# Patient Record
Sex: Female | Born: 1975 | Hispanic: No | Marital: Single | Smoking: Never smoker
Health system: Southern US, Community
[De-identification: ages and names within clinical notes are randomized; demographics above are authoritative.]

## PROBLEM LIST (undated history)

## (undated) DIAGNOSIS — S329XXA Fracture of unspecified parts of lumbosacral spine and pelvis, initial encounter for closed fracture: Secondary | ICD-10-CM

## (undated) HISTORY — DX: Fracture of unspecified parts of lumbosacral spine and pelvis, initial encounter for closed fracture: S32.9XXA

---

## 1999-04-19 ENCOUNTER — Inpatient Hospital Stay (HOSPITAL_COMMUNITY): Admission: AD | Admit: 1999-04-19 | Discharge: 1999-04-24 | Payer: Self-pay | Admitting: Obstetrics & Gynecology

## 1999-04-20 ENCOUNTER — Encounter: Payer: Self-pay | Admitting: Obstetrics & Gynecology

## 1999-10-07 ENCOUNTER — Inpatient Hospital Stay (HOSPITAL_COMMUNITY): Admission: AD | Admit: 1999-10-07 | Discharge: 1999-10-07 | Payer: Self-pay | Admitting: Obstetrics & Gynecology

## 1999-10-08 ENCOUNTER — Inpatient Hospital Stay (HOSPITAL_COMMUNITY): Admission: AD | Admit: 1999-10-08 | Discharge: 1999-10-08 | Payer: Self-pay | Admitting: *Deleted

## 1999-10-08 ENCOUNTER — Inpatient Hospital Stay (HOSPITAL_COMMUNITY): Admission: AD | Admit: 1999-10-08 | Discharge: 1999-10-11 | Payer: Self-pay | Admitting: *Deleted

## 2001-01-20 ENCOUNTER — Ambulatory Visit (HOSPITAL_COMMUNITY): Admission: RE | Admit: 2001-01-20 | Discharge: 2001-01-20 | Payer: Self-pay | Admitting: *Deleted

## 2001-03-30 ENCOUNTER — Inpatient Hospital Stay (HOSPITAL_COMMUNITY): Admission: AD | Admit: 2001-03-30 | Discharge: 2001-03-30 | Payer: Self-pay | Admitting: *Deleted

## 2001-05-13 ENCOUNTER — Inpatient Hospital Stay (HOSPITAL_COMMUNITY): Admission: AD | Admit: 2001-05-13 | Discharge: 2001-05-13 | Payer: Self-pay | Admitting: *Deleted

## 2001-06-17 ENCOUNTER — Inpatient Hospital Stay (HOSPITAL_COMMUNITY): Admission: AD | Admit: 2001-06-17 | Discharge: 2001-06-17 | Payer: Self-pay | Admitting: Obstetrics & Gynecology

## 2001-06-18 ENCOUNTER — Inpatient Hospital Stay (HOSPITAL_COMMUNITY): Admission: AD | Admit: 2001-06-18 | Discharge: 2001-06-20 | Payer: Self-pay | Admitting: *Deleted

## 2002-03-22 ENCOUNTER — Inpatient Hospital Stay (HOSPITAL_COMMUNITY): Admission: AD | Admit: 2002-03-22 | Discharge: 2002-03-22 | Payer: Self-pay | Admitting: *Deleted

## 2002-04-28 ENCOUNTER — Encounter: Admission: RE | Admit: 2002-04-28 | Discharge: 2002-04-28 | Payer: Self-pay | Admitting: Family Medicine

## 2002-05-05 ENCOUNTER — Encounter: Admission: RE | Admit: 2002-05-05 | Discharge: 2002-05-05 | Payer: Self-pay | Admitting: Family Medicine

## 2002-05-19 ENCOUNTER — Ambulatory Visit (HOSPITAL_COMMUNITY): Admission: RE | Admit: 2002-05-19 | Discharge: 2002-05-19 | Payer: Self-pay | Admitting: Family Medicine

## 2002-05-26 ENCOUNTER — Encounter: Admission: RE | Admit: 2002-05-26 | Discharge: 2002-05-26 | Payer: Self-pay | Admitting: Sports Medicine

## 2002-06-24 ENCOUNTER — Encounter: Admission: RE | Admit: 2002-06-24 | Discharge: 2002-06-24 | Payer: Self-pay | Admitting: Family Medicine

## 2002-07-25 ENCOUNTER — Encounter: Admission: RE | Admit: 2002-07-25 | Discharge: 2002-07-25 | Payer: Self-pay | Admitting: Family Medicine

## 2002-07-29 ENCOUNTER — Encounter: Admission: RE | Admit: 2002-07-29 | Discharge: 2002-07-29 | Payer: Self-pay | Admitting: Family Medicine

## 2002-08-24 ENCOUNTER — Encounter: Admission: RE | Admit: 2002-08-24 | Discharge: 2002-08-24 | Payer: Self-pay | Admitting: Family Medicine

## 2002-09-08 ENCOUNTER — Encounter: Admission: RE | Admit: 2002-09-08 | Discharge: 2002-09-08 | Payer: Self-pay | Admitting: Family Medicine

## 2002-09-21 ENCOUNTER — Inpatient Hospital Stay (HOSPITAL_COMMUNITY): Admission: AD | Admit: 2002-09-21 | Discharge: 2002-09-21 | Payer: Self-pay | Admitting: Obstetrics and Gynecology

## 2002-09-21 ENCOUNTER — Encounter: Payer: Self-pay | Admitting: Obstetrics and Gynecology

## 2002-10-13 ENCOUNTER — Encounter: Admission: RE | Admit: 2002-10-13 | Discharge: 2002-10-13 | Payer: Self-pay | Admitting: Family Medicine

## 2002-10-21 ENCOUNTER — Encounter: Admission: RE | Admit: 2002-10-21 | Discharge: 2002-10-21 | Payer: Self-pay | Admitting: Family Medicine

## 2002-10-23 ENCOUNTER — Inpatient Hospital Stay (HOSPITAL_COMMUNITY): Admission: AD | Admit: 2002-10-23 | Discharge: 2002-10-24 | Payer: Self-pay | Admitting: *Deleted

## 2002-12-02 ENCOUNTER — Encounter: Admission: RE | Admit: 2002-12-02 | Discharge: 2002-12-02 | Payer: Self-pay | Admitting: Family Medicine

## 2002-12-29 ENCOUNTER — Encounter: Admission: RE | Admit: 2002-12-29 | Discharge: 2002-12-29 | Payer: Self-pay | Admitting: Family Medicine

## 2009-09-20 ENCOUNTER — Emergency Department (HOSPITAL_COMMUNITY): Admission: EM | Admit: 2009-09-20 | Discharge: 2009-09-20 | Payer: Self-pay | Admitting: Emergency Medicine

## 2010-02-13 ENCOUNTER — Inpatient Hospital Stay (HOSPITAL_COMMUNITY): Admission: AD | Admit: 2010-02-13 | Discharge: 2010-02-13 | Payer: Self-pay | Admitting: Obstetrics & Gynecology

## 2010-02-15 ENCOUNTER — Ambulatory Visit (HOSPITAL_COMMUNITY): Admission: RE | Admit: 2010-02-15 | Discharge: 2010-02-15 | Payer: Self-pay | Admitting: Obstetrics & Gynecology

## 2010-06-12 ENCOUNTER — Inpatient Hospital Stay (HOSPITAL_COMMUNITY): Admission: AD | Admit: 2010-06-12 | Discharge: 2010-06-15 | Payer: Self-pay | Admitting: Obstetrics & Gynecology

## 2010-06-12 ENCOUNTER — Ambulatory Visit: Payer: Self-pay | Admitting: Family Medicine

## 2010-11-03 NOTE — L&D Delivery Note (Signed)
Delivery Note At 2:23 PM a viable female was delivered via Vaginal, Spontaneous Delivery (Presentation: Left Occiput Anterior).  APGAR: 9, 9; weight 7 lb 12.9 oz (3541 g).   Placenta status: Intact, Spontaneous, accessory lobe noted.  Cord: 3 vessels with the following complications: None.  Cord pH: none  Anesthesia: Epidural  Episiotomy: None Lacerations: None Suture Repair: none Est. Blood Loss (mL): 250  Mom to postpartum.  Baby to nursery-stable. Placenta to birthing suites. Plans to breast and bottle feed. Contraception undecided.   Danesha Kirchoff 09/05/2011, 2:55 PM

## 2011-01-17 LAB — CBC
HCT: 35.5 % — ABNORMAL LOW (ref 36.0–46.0)
Hemoglobin: 11.5 g/dL — ABNORMAL LOW (ref 12.0–15.0)
Hemoglobin: 12.2 g/dL (ref 12.0–15.0)
MCH: 29.8 pg (ref 26.0–34.0)
MCH: 30.5 pg (ref 26.0–34.0)
MCHC: 34.3 g/dL (ref 30.0–36.0)
MCV: 86.6 fL (ref 78.0–100.0)
MCV: 89.2 fL (ref 78.0–100.0)
Platelets: 178 10*3/uL (ref 150–400)
RBC: 3.78 MIL/uL — ABNORMAL LOW (ref 3.87–5.11)
RBC: 4.1 MIL/uL (ref 3.87–5.11)
RDW: 14.9 % (ref 11.5–15.5)
WBC: 6.8 10*3/uL (ref 4.0–10.5)

## 2011-01-17 LAB — RPR: RPR Ser Ql: NONREACTIVE

## 2011-01-17 LAB — URINALYSIS, ROUTINE W REFLEX MICROSCOPIC
Bilirubin Urine: NEGATIVE
Glucose, UA: NEGATIVE mg/dL
Ketones, ur: NEGATIVE mg/dL
pH: 7 (ref 5.0–8.0)

## 2011-01-17 LAB — URINE MICROSCOPIC-ADD ON

## 2011-01-22 LAB — URINALYSIS, ROUTINE W REFLEX MICROSCOPIC
Ketones, ur: NEGATIVE mg/dL
Nitrite: NEGATIVE
Specific Gravity, Urine: 1.02 (ref 1.005–1.030)
Urobilinogen, UA: 0.2 mg/dL (ref 0.0–1.0)
pH: 6.5 (ref 5.0–8.0)

## 2011-01-22 LAB — WET PREP, GENITAL
Clue Cells Wet Prep HPF POC: NONE SEEN
Yeast Wet Prep HPF POC: NONE SEEN

## 2011-01-22 LAB — URINE CULTURE: Culture: NO GROWTH

## 2011-01-22 LAB — URINE MICROSCOPIC-ADD ON

## 2011-05-19 ENCOUNTER — Other Ambulatory Visit: Payer: Self-pay

## 2011-05-19 DIAGNOSIS — Z3480 Encounter for supervision of other normal pregnancy, unspecified trimester: Secondary | ICD-10-CM

## 2011-05-19 NOTE — Progress Notes (Signed)
Prenatal labs done today Tracey Valdez 

## 2011-05-20 LAB — OBSTETRIC PANEL
Antibody Screen: NEGATIVE
Basophils Relative: 0 % (ref 0–1)
Eosinophils Absolute: 0 10*3/uL (ref 0.0–0.7)
Eosinophils Relative: 0 % (ref 0–5)
HCT: 36.5 % (ref 36.0–46.0)
Hemoglobin: 12.2 g/dL (ref 12.0–15.0)
Lymphs Abs: 1.5 10*3/uL (ref 0.7–4.0)
MCH: 30.5 pg (ref 26.0–34.0)
MCHC: 33.4 g/dL (ref 30.0–36.0)
MCV: 91.3 fL (ref 78.0–100.0)
Monocytes Absolute: 0.4 10*3/uL (ref 0.1–1.0)
Monocytes Relative: 5 % (ref 3–12)
RBC: 4 MIL/uL (ref 3.87–5.11)
Rh Type: POSITIVE

## 2011-05-20 LAB — SICKLE CELL SCREEN: Sickle Cell Screen: NEGATIVE

## 2011-05-21 LAB — CULTURE, OB URINE

## 2011-05-26 ENCOUNTER — Encounter: Payer: Self-pay | Admitting: Family Medicine

## 2011-06-11 ENCOUNTER — Encounter: Payer: Self-pay | Admitting: Family Medicine

## 2011-07-08 ENCOUNTER — Encounter: Payer: Self-pay | Admitting: Family Medicine

## 2011-07-08 ENCOUNTER — Ambulatory Visit (INDEPENDENT_AMBULATORY_CARE_PROVIDER_SITE_OTHER): Payer: Self-pay | Admitting: Family Medicine

## 2011-07-08 VITALS — BP 94/62 | Temp 98.7°F | Wt 142.0 lb

## 2011-07-08 DIAGNOSIS — Z3480 Encounter for supervision of other normal pregnancy, unspecified trimester: Secondary | ICD-10-CM

## 2011-07-08 DIAGNOSIS — O09529 Supervision of elderly multigravida, unspecified trimester: Secondary | ICD-10-CM

## 2011-07-08 DIAGNOSIS — IMO0002 Reserved for concepts with insufficient information to code with codable children: Secondary | ICD-10-CM

## 2011-07-08 DIAGNOSIS — Z3490 Encounter for supervision of normal pregnancy, unspecified, unspecified trimester: Secondary | ICD-10-CM

## 2011-07-08 LAB — GLUCOSE, CAPILLARY: Glucose-Capillary: 147 mg/dL — ABNORMAL HIGH (ref 70–99)

## 2011-07-08 NOTE — Patient Instructions (Signed)
Nice to meet you. Call Rudell Cobb about the [employer] and we can schedule an ultrasound. Make appointment in 2 weeks. Call [facility] for intrepretor.  Embarazo - Segundo trimestre (Pregnancy - Second Trimester) El segundo trimestre del Psychiatrist (del 3 al ) es un perodo de evolucin rpida para usted y el beb. Hacia el final del sexto mes, el beb mide aproximadamente 23 cm y pesa 680 g. Comenzar a Pharmacologist del beb [place] 18 y las 20 [address] de [place]. Podr sentir las pataditas ("quickening en ingls"). Hay un rpido [employer]. Puede segregar un lquido claro Charity fundraiser) de las [place]. Quizs sienta pequeas contracciones en el vientre (tero) Esto se conoce como falso trabajo de parto o contracciones de Braxton-H[place]. Es como una prctica del trabajo de parto que se produce cuando el beb est listo para salir. Generalmente los problemas de vmitos matinales ya se han superado hacia el final del Medical sales representative. Algunas mujeres desarrollan pequeas manchas oscuras (que se denominan cloasma, mscara del embarazo) en la cara que normalmente se van luego del nacimiento del beb. La exposicin al sol empeora las manchas. Puede desarrollarse acn en algunas mujeres embarazadas, y puede desaparecer en aquellas que ya tienen acn. EXAMENES PRENATALES  Durante los [employer], deber seguir realizando pruebas de [place], segn avance el [place]. Estas pruebas se realizan para controlar su salud y la del beb. Tambin se realizan anlisis de sangre para [employer] niveles de [place]. La anemia (bajo nivel de hemoglobina) es frecuente durante el embarazo. Para prevenirla, se administran hierro y vitaminas. Tambin se le realizarn exmenes para saber si tiene diabetes entre las 24 y las 28 semanas del [place]. Podrn repetirle algunas de las [employer] Enterprises hicieron previamente.   En cada visita le medirn el tamao del tero. Esto se realiza para asegurarse de  que el beb est creciendo correctamente de acuerdo al estado del [place].   Tambin en cada visita prenatal controlarn su presin arterial. Esto se realiza para asegurarse de que no tenga toxemia.   Se controlar su orina para asegurarse de que no tenga infecciones, diabetes o protena en la orina.   Se controlar su peso regularmente para asegurarse que el aumento ocurre al ritmo indicado. Esto se hace para asegurarse que usted y el beb tienen una evolucin normal.   En algunas ocasiones se realiza una prueba de ultrasonido para confirmar el correcto desarrollo y evolucin del beb. Esta prueba se realiza con ondas sonoras inofensivas para el beb, de modo que el profesional pueda calcular ms precisamente la fecha del [place].  Algunas veces se realizan pruebas especializadas del lquido amnitico que rodea al beb. Esta prueba se denomina amniocentesis. El lquido amnitico se obtiene introduciendo una aguja en el vientre (abdomen). Se realiza para Conservator, museum/gallery en los que existe alguna preocupacin acerca de algn problema gentico que pueda sufrir el beb. En ocasiones se lleva a cabo cerca del final del embarazo, si es necesario inducir al Apple Computer. En este caso se realiza para asegurarse que los pulmones del beb estn lo suficientemente maduros como para que pueda vivir fuera del tero. CAMBIOS QUE OCURREN EN EL SEGUNDO TRIMESTRE DEL EMBARAZO Su organismo atravesar numerosos cambios durante el [employer]. Estos pueden variar de [name] persona a otra. [place] con el profesional que la asiste acerca los cambios que usted note y que la preocupen.  Durante el segundo trimestre probablemente sienta un aumento del apetito. Es normal tener "antojos" de Development worker, community.  Esto vara de [name] persona a otra y de un embarazo a Therapist, art.   El abdomen inferior comenzar a abultarse.   Podr tener la necesidad de Geographical information systems officer con ms frecuencia debido a que el tero y el beb presionan sobre  la vejiga. Tambin es frecuente contraer ms infecciones urinarias durante el embarazo (dolor al ConocoPhillips). Puede evitarlas bebiendo gran cantidad de lquidos y vaciando la vejiga antes y despus de Sales promotion account executive.   Podrn aparecer las primeras estras en las caderas, abdomen y [place]. Estos son cambios normales del cuerpo durante el [place]. No existen medicamentos ni ejercicios que puedan prevenir [employer].   Es posible que comience a desarrollar venas inflamadas y abultadas (varices) en las piernas. El uso de medias de descanso, Optometrist sus pies durante 15 minutos, 3 a 4 veces al da y Film/video editor la sal en su dieta ayuda a Journalist, newspaper.   Podr sentir Engineering geologist gstrica a medida que el tero crece y Doctor, general practice. Puede tomar anticidos, con la autorizacin de su mdico, para Financial planner. Tambin es til ingerir pequeas comidas 4 a 5 veces al Futures trader.   La constipacin puede tratarse con un laxante o agregando fibra a su dieta. Beber grandes cantidades de lquidos, comer vegetales, frutas y granos integrales es de [place].   Tambin es beneficioso practicar actividad fsica. Si ha sido una persona Engineer, mining, podr continuar con la Harley[employer]Davidson de las actividades durante el mismo. Si ha sido [employer], puede ser beneficioso que comience con un programa de ejercicios, Museum/gallery exhibitions officer.   Puede desarrollar hemorroides (vrices en el recto) hacia el final del segundo trimestre. Tomar baos de asiento tibios y Chemical engineer cremas recomendadas por el profesional que lo asiste sern de ayuda para los problemas de hemorroides.   Tambin podr Financial risk analyst de espalda durante este momento de su embarazo. Evite levantar objetos pesados, utilice zapatos de taco bajo y [place] buena postura para ayudar a reducir los problemas de [place].   Algunas mujeres embarazadas desarrollan hormigueo y adormecimiento de la mano y los dedos debido a la  hinchazn y compresin de los ligamentos de la mueca (sndrome del tnel carpiano). Esto desaparece una vez que el beb nace.   Como sus pechos se agrandan, Pension scheme manager un sujetador ms grande. Use un sostn de soporte, cmodo y de algodn. No utilice un sostn para amamantar hasta el ltimo mes de embarazo si va a amamantar al beb.   Podr observar una lnea oscura desde el ombligo hacia la zona pbica denominada linea nigra.   Podr observar que sus mejillas se ponen coloradas debido al aumento de flujo sanguneo en la cara.   Podr desarrollar "araitas" en la cara, cuello y pecho. Esto desaparece una vez que el beb nace.  INSTRUCCIONES PARA EL CUIDADO DOMICILIARIO  Es extremadamente importante que evite el cigarrillo, hierbas medicinales, alcohol y las drogas no prescriptas durante el Psychiatrist. Estas sustancias qumicas afectan la formacin y el desarrollo del beb. Evite estas sustancias durante todo el embarazo para asegurar el nacimiento de un beb sano.   La mayor parte de los cuidados que se aconsejan son los mismos que los indicados para Financial risk analyst trimestre del Psychiatrist. Cumpla con las citas tal como se le indic. Siga las instrucciones del profesional que lo asiste con respecto al uso de los medicamentos, el ejercicio y Psychologist, forensic.   Durante el embarazo debe obtener nutrientes para usted y para su beb. Consuma alimentos balanceados  a intervalos regulares. Elija alimentos como carne, pescado, [place] y otros productos lcteos descremados, vegetales, frutas, panes integrales y cereales. El Equities trader cul es el aumento de peso ideal.   Las relaciones sexuales fsicas pueden continuarse hasta cerca del fin del embarazo si no existen otros problemas. Estos problemas pueden ser la prdida temprana (prematura) de lquido amnitico de las [place], sangrado vaginal, dolor abdominal u otros problemas mdicos o del Psychiatrist.   Realice Tesoro Corporation, si no tiene  restricciones. Consulte con el profesional que la asiste si no sabe con certeza si determinados ejercicios son seguros. El mayor aumento de peso tiene Environmental consultant durante los ltimos 2 trimestres del Psychiatrist. El ejercicio la ayudar a:   Engineering geologist.   Ponerla en forma para el parto.   Ayudarla a perder peso luego de haber dado a luz.   Use un buen sostn o como los que se usan para hacer deportes para Paramedic la sensibilidad de las [place]. Tambin puede serle til si lo [place] mientras duerme. Si pierde Product manager, podr Parker Hann[employer]in.   No utilice la baera con agua caliente, baos turcos y saunas durante el [address]   Utilice el cinturn de seguridad sin excepcin cuando conduzca. Este la proteger a usted y al beb en caso de accidente.   Evite comer carne cruda, queso crudo, y el contacto con los utensilios y desperdicios de los gatos. Estos elementos contienen grmenes que pueden causar defectos de nacimiento en el beb.   El segundo trimestre es un buen momento para visitar a su dentista y Software engineer si an no lo ha hecho. Es Primary school teacher los dientes limpios. Utilice un cepillo de dientes blando. Cepllese ms suavemente durante el embarazo.   Es ms fcil perder algo de orina durante el [place]. Apretar y Chief Operating Officer los msculos de la pelvis la ayudar con este problema. Practique detener la miccin cuando est en el bao. Estos son los mismos msculos que Development worker, international aid. Son [employer] Partners mismos msculos que utiliza cuando trata de [employer]der System gases. Puede practicar apretando estos msculos 10 veces, y repetir esto 3 veces por da aproximadamente. Una vez que conozca qu msculos debe apretar, no realice estos ejercicios durante la miccin. Puede favorecerle una infeccin si la orina vuelve hacia atrs.   Pida ayuda si tiene necesidades econmicas, de asesoramiento o nutricionales durante el [place]. El profesional podr ayudarla con respecto a estas  necesidades, o derivarla a otros especialistas.   La piel puede ponerse grasa. Si esto sucede, lvese la cara con un jabn [place], utilice un humectante no graso y [place] con base de aceite o crema.  CONSUMO DE MEDICAMENTOS Y DROGAS DURANTE EL EMBARAZO  Contine tomando las vitaminas apropiadas para esta etapa tal como se le indic. Las vitaminas deben contener un miligramo de cido flico y deben suplementarse con hierro. Guarde todas las vitaminas fuera del alcance de los nios. La ingestin de slo un par de vitaminas o tabletas que contengan hierro puede ocasionar la Newmont Mining en un beb o en un nio pequeo.   Evite el uso de [place], inclusive los de venta libre y hierbas que no hayan sido prescriptos o indicados por el profesional que la asiste. Algunos medicamentos pueden causar problemas fsicos al beb. Utilice los medicamentos de venta libre o de prescripcin para Chief Technology Officer, Environmental health practitioner o la [place], segn se lo indique el profesional que lo asiste. No utilice aspirina.   El consumo de alcohol  est relacionado con ciertos defectos de nacimiento. Esto incluye el sndrome de alcoholismo fetal. Debe evitar el consumo de alcohol en cualquiera de sus formas. El cigarrillo causa nacimientos prematuros y bebs de [place]. El uso de drogas recreativas est absolutamente prohibido. Son muy nocivas para el beb. Un beb que nace de [employer] Express, ser adicto al nacer. Ese beb tendr los mismos sntomas de abstinencia que un adulto.   Infrmele al profesional si consume alguna droga.   No consuma drogas ilegales. Pueden causarle mucho dao al beb.  SOLICITE ATENCIN MDICA SI:  Tiene preguntas o preocupaciones durante su embarazo. Es mejor que llame para Science writer las dudas que esperar hasta su prxima visita prenatal. Thressa Sheller forma se sentir ms tranquila.  SOLICITE ATENCIN MDICA DE INMEDIATO SI:  La temperatura oral se eleva sin motivo por encima de 101 o segn le indique el  profesional que lo asiste.   Tiene una prdida de lquido por la vagina (canal de parto). Si sospecha una ruptura de las [place], tmese la temperatura y llame al profesional para informarlo sobre esto.   Observa unas pequeas manchas, una hemorragia vaginal o elimina cogulos. Notifique al profesional acerca de la cantidad y de cuntos apsitos est utilizando. Unas pequeas manchas de sangre son algo comn durante el Psychiatrist, especialmente despus de Sales promotion account executive.   Presenta un olor desagradable en la secrecin vaginal y observa un cambio en el color, de transparente a blanco.   Contina con las nuseas y no obtiene alivio de los remedios indicados. Vomita sangre o algo similar a la borra del caf.   [place] o sube ms de 900 g. en una semana, o segn lo indicado por el profesional que la asiste.   Observa que se le Southwest Airlines, las manos, los pies o las piernas.   Ha estado expuesta a la rubola y no ha sufrido la enfermedad.   Ha estado expuesta a la quinta enfermedad o a la varicela.   Presenta dolor abdominal. Las molestias en el ligamento redondo son [name] causa no cancerosa (benigna) frecuente de dolor abdominal durante el embarazo. El profesional que la asiste deber evaluarla.   Presenta dolor de cabeza intenso que no se [place].   Presenta fiebre, diarrea, dolor al orinar o le falta la respiracin.   Presenta dificultad para ver, visin borrosa, o visin doble.   Sufre una cada, un accidente de trnsito o cualquier tipo de trauma.   Vive en un hogar en el que existe violencia fsica o mental.  Document Released: 07/30/2005 Document Re-Released: 01/14/2010 [facility] Patient Information 2011 [place], [place].

## 2011-07-08 NOTE — Progress Notes (Signed)
35 yo G5P4004 presents as NOB at 28.3 wk by LMP. No previous complications in 4 SVD. No health conditions. Last baby was born in 06/2010, had PNC at [facility]. Now using adopt-a-mom program. No active issues. Offered genetic counseling today. PHQ-9 score is 7 today.  O: GEN: NAD, No oropharyngeal lesions. CV RRR, no murmurs, Lungs CTAB, fundus firm measures 28 cm. No motor or gross neurologic deficits. No edema, 2+ radial and DP pulses. No vaginal lesions, scant white discharge. Cervix wnl, closed. No tenderness.  A/P: 35 yo G5P4 at 28.3 wks presents with normal pregnancy, AMA, short interval, and late to [facility]. Need to obtain US for anatomy and dating. Pt to obtain orange card or schedule with Dr. Elsie Stain office in next week. Labs reviewed and normal. Glucola screening, pap, GC/Chl today. Discussed preterm labor precautions. Will f/u in 2 weeks for ROB.

## 2011-07-11 ENCOUNTER — Other Ambulatory Visit: Payer: Self-pay

## 2011-07-11 ENCOUNTER — Other Ambulatory Visit: Payer: Self-pay | Admitting: Family Medicine

## 2011-07-11 DIAGNOSIS — Z3490 Encounter for supervision of normal pregnancy, unspecified, unspecified trimester: Secondary | ICD-10-CM

## 2011-07-11 LAB — GLUCOSE, CAPILLARY: Glucose-Capillary: 96 mg/dL (ref 70–99)

## 2011-07-11 NOTE — Progress Notes (Signed)
3 HR GTT DONE TODAY Mackynzie Woolford 

## 2011-07-12 LAB — GLUCOSE TOLERANCE, 3 HOURS
Glucose Tolerance, 2 hour: 100 mg/dL (ref 70–164)
Glucose Tolerance, Fasting: 90 mg/dL (ref 70–104)
Glucose, GTT - 3 Hour: 125 mg/dL (ref 70–144)

## 2011-07-15 NOTE — Progress Notes (Signed)
Note reviewed. 35 yo G5 with following risks: AMA, short interpregnancy interval, late to care, smoking. 1 hour glucola elevated, 3 hour normal. Pt to have ultrasound soon (applying for orange card - if not approved by end of week, schedule with Dr. Gaynell Face) F/u [facility] 2 week interval.

## 2011-07-21 ENCOUNTER — Ambulatory Visit (INDEPENDENT_AMBULATORY_CARE_PROVIDER_SITE_OTHER): Payer: Self-pay | Admitting: Family Medicine

## 2011-07-21 ENCOUNTER — Telehealth: Payer: Self-pay | Admitting: Family Medicine

## 2011-07-21 VITALS — BP 102/58 | Temp 97.9°F | Wt 146.0 lb

## 2011-07-21 DIAGNOSIS — Z3490 Encounter for supervision of normal pregnancy, unspecified, unspecified trimester: Secondary | ICD-10-CM

## 2011-07-21 DIAGNOSIS — Z23 Encounter for immunization: Secondary | ICD-10-CM

## 2011-07-21 DIAGNOSIS — Z3480 Encounter for supervision of other normal pregnancy, unspecified trimester: Secondary | ICD-10-CM

## 2011-07-21 NOTE — Telephone Encounter (Signed)
Patient brought in her [facility] card and I scanned it into her chart.  She said that you needed this in order to make her an ultrasound appt.

## 2011-07-21 NOTE — Patient Instructions (Signed)
Nice to see you! When you get orange card, please call the clinic to schedule your ultrasound! ([phone]) Make an appointment in 2 weeks. Go to [facility] if you have bleeding, contractions, or your water breaks.  Trabajo de parto prematuro,  cuidados en Advice worker (Preterm Labor, Home Care) Se denomina parto prematuro a las contracciones uterinas que causan la apertura (dilatacin), acortamiento y afinamiento del cuello del tero, antes de las 37 semanas de [place]. Es la mayor causa de ingreso de mujeres embarazadas al hospital. [place] causas del [place] prematuro son:  Katha Hamming de los casos se desencadena por motivos desconocidos.   Pequeas zonas de separacin de la placenta (abrupcin).   Polihidramnios (lquido en exceso en el saco amnitico).   Embarazo de gemelos o ms bebs.   Cerviz incompetente (no puede contener al beb debido a que el tejido es demasiado dbil).   Cambios hormonales.   Hemorragia vaginal en ms de uno de los trimestres.   Infeccin en el crvix, la vagin o la vejiga   El consumo de cigarrillos.   Sindrome antifosfolipdico Se produce cuando los anticuerpos afectan las protenas del organismo.  DIAGNSTICO Los factores que ayudan a predecir el trabajo de parto prematuro son:  Historias de Proofreader con trabajo de parto prematuro.   Vaginosis bacteriana en las mujeres que tuvieron trabajo de [place].   Monitoreo de la actividad uterina que demuestre contracciones uterinas.   La protena fibronectina fetal est elevada en mujeres con historia previa del trastorno.   La medicin del largo del crvix con tcnicas de ultrasonido muestra signos de acortamiento antes de la fecha [place].   Realizando una evaluacin conjunta con fibronectina y ecografa cervical, se puede predecir mejor un parto prematuro inminente.   Otros factores de riesgo son:   No pertenecer a Biomedical engineer.   Embarazo a los 17 aos o  [address]   Embarazo a los 35 aos o ms.   Nivel socioeconmico bajo.   Aumento de peso deficiente durante el [employer].  PREVENCIN No todos los partos prematuros pueden prevenirse. Algunas contracciones prematuras pueden prevenirse con medidas simples.  Mantngase bien hidratada. Beba 8 vasos de Warehouse manager. Esto disminuye la posibilidad del parto prematuro. El porcentaje de partos prematuros aumenta en los meses de verano. La deshidratacin hace que el volumen de sangre disminuya. Esto aumenta la concentracin de oxitocina (la hormona que produce las contracciones uterinas) en la [place]. La hidratacin ayuda a prevenir este incremento.   Observe si se presentan signos de infeccin. Estos signos incluyen la sensacin de ardor o el aumento de la necesidad de Geographical information systems officer, secrecin vaginal anormal o el aumento de temperatura sin causa aparente. Esto puedo Engineer, drilling.   Concurra puntualmente a las citas con el profesional que la asiste. Comunquese inmediatamente con el mdico si siente contracciones uterinas.   Busque asesoramiento mdico si tiene preguntas o surge algn problema. Es mejor hacerle las preguntas al profesional que tener un trabajo de parto prematuro sin TEFL teacher.  MANEJO DEL PARTO [employer], DENTRO Y [facility] Hay varios cosas para controlar durante el parto prematuro. Entre ellas se incluyen tanto las medidas mdicas como el cuidado personal para usted y su beb. Generalmente este problema se trata en el hospital. Algunas medidas pueden ser de gran ayuda en el parto prematuro:  Hidratacin (oral o intravenosa) Beba 8 vasos de Warehouse manager.   Haga reposo en cama (en su casa o en el hospital)  Puede resultarle de gran ayuda recostarse sobre el lado izquierdo.   Evite las [employer] y los orgasmos.   Algunos medicamentos (antibiticos) la ayudarn en la prevencin de infecciones. Corre ms riesgos si ha roto la bolsa o si las contracciones estn  causadas por una infeccin [employer] medicamentos como se le indic.   Evaluacin del beb. Estas pruebas ayudarn al profesional a saber si el beb se encuentra en buen estado y lo que Media planner en caso que se produzca el nacimiento de [place] anticipada. Entre ella se incluyen:   El perfil biofsico   Pruebas de estrs y no estrs.   Amniocentesis, para evaluar la madurez pulmonar del feto.   ndice del volumen del lquido amnitico.   Prueba de [place].   Hay medicamentos que ayudan a que los pulmones del beb se desarrollen ms rpidamente. Esto puede suceder si el parto prematuro no puede detenerse.   El profesional podr darle otros consejos acerca de la preparacin en caso de un nacimiento prematuro. Puede ser de ayuda si es necesario administrar corticoides para ayudar a la maduracin de los pulmones del beb.   Los tocolticos (frmacos que ayudan a TEFL teacher las contracciones uterinas) pueden ayudar a Veterinary surgeon [address]  [address]   En algunos casos es beneficiosa la administracin de progesterona.  TRATAMIENTO El mejor tratamiento es la prevencin, Solicitor los factores de riesgo y la deteccin temprana. Asegrese de Science writer con el profesional cules son los signos y los sntomas (qu puede ocurrir) del Sport and exercise psychologist, especialmente si ha sufrido un trabajo de parto prematuro en un embarazo previo. INSTRUCCIONES PARA EL CUIDADO DOMICILIARIO:  Consuma una dieta balanceada y nutritiva.   Tome las vitaminas como se le indic.   Beba entre 6 y 8 vasos de lquidos por Futures trader.   Descanse y duerma lo suficiente.   No tenga relaciones sexuales si tiene un trabajo de parto prematuro o tiene alto riesgo de tenerlo.   Siga las recomendaciones de su mdico con respecto a las actividades, [address], anlisis de [place] y otros exmenes (ecografas, amniocentesis).   Evite el estrs.   Evite los trabajos extenuantes o la actividad fsica prolongada si tiene riesgo elevado  de parto prematuro.   No fume.  SOLICITE ATENCIN MDICA DE INMEDIATO SI:  Tiene contracciones.   Siente dolor abdominal.   Presenta una hemorragia vaginal abundante.   Siente dolor al [employer]oco[employer]illips.   Observa una hemorragia vaginal anormal.   La temperatura se eleva por encima de 102 F (38,9 C).  Document Released: 07/30/2005 Document Re-Released: 08/17/2009 [facility] Patient Information 2011 [place], [place].

## 2011-07-21 NOTE — Progress Notes (Signed)
30.2 ROB visit. Patient refuses interpretor today, has her toddler with her. Has no complaints. Good fetal activity. No bleeding or pain. Has a mild discharge. We reviewed normal labwork done last week. Pt has not yet received orange card but states that she has a letter to bring to [employer] today. Intends to get this so we can schedule [place] ASAP for dating and anatomy. A/P: 35 yo G5P4004 at 30.2 wk by LMP. Complicated by AMA, late to care, financial barriers. Most pressing is the screening ultrasound. Pt to see Rudell Cobb today. Will bring a copy of card to office, if not in next 2 days will call and schedule appointment with Dr. Gaynell Face for [place]. Discussed smoking risks to fetus and PTL precautions. Will f/u in 2 weeks for ROB.

## 2011-07-23 NOTE — Telephone Encounter (Signed)
appt scheduled for Tracey Valdez at [facility] hospital for Thursday 09.20.2012 @ 915 am. I will have Marines Atkins call Tracey Valdez to inform her of this appt.Laureen Ochs, Viann Shove

## 2011-07-24 ENCOUNTER — Ambulatory Visit (HOSPITAL_COMMUNITY)
Admission: RE | Admit: 2011-07-24 | Discharge: 2011-07-24 | Disposition: A | Discharge status: Home / Self Care | Payer: Self-pay | Source: Ambulatory Visit | Attending: Family Medicine | Admitting: Family Medicine

## 2011-07-24 DIAGNOSIS — O358XX0 Maternal care for other (suspected) fetal abnormality and damage, not applicable or unspecified: Secondary | ICD-10-CM | POA: Insufficient documentation

## 2011-07-24 DIAGNOSIS — O09529 Supervision of elderly multigravida, unspecified trimester: Secondary | ICD-10-CM | POA: Insufficient documentation

## 2011-07-24 DIAGNOSIS — Z363 Encounter for antenatal screening for malformations: Secondary | ICD-10-CM | POA: Insufficient documentation

## 2011-07-24 DIAGNOSIS — O0930 Supervision of pregnancy with insufficient antenatal care, unspecified trimester: Secondary | ICD-10-CM | POA: Insufficient documentation

## 2011-07-24 DIAGNOSIS — Z3490 Encounter for supervision of normal pregnancy, unspecified, unspecified trimester: Secondary | ICD-10-CM

## 2011-07-24 DIAGNOSIS — Z1389 Encounter for screening for other disorder: Secondary | ICD-10-CM | POA: Insufficient documentation

## 2011-07-24 DIAGNOSIS — O99330 Smoking (tobacco) complicating pregnancy, unspecified trimester: Secondary | ICD-10-CM | POA: Insufficient documentation

## 2011-08-08 ENCOUNTER — Ambulatory Visit (INDEPENDENT_AMBULATORY_CARE_PROVIDER_SITE_OTHER): Payer: Self-pay | Admitting: Family Medicine

## 2011-08-08 ENCOUNTER — Encounter: Payer: Self-pay | Admitting: Family Medicine

## 2011-08-08 VITALS — BP 106/65 | Temp 97.3°F | Wt 150.0 lb

## 2011-08-08 DIAGNOSIS — Z3490 Encounter for supervision of normal pregnancy, unspecified, unspecified trimester: Secondary | ICD-10-CM

## 2011-08-08 DIAGNOSIS — Z3480 Encounter for supervision of other normal pregnancy, unspecified trimester: Secondary | ICD-10-CM

## 2011-08-08 LAB — CBC
HCT: 34.1 % — ABNORMAL LOW (ref 36.0–46.0)
Hemoglobin: 10.7 g/dL — ABNORMAL LOW (ref 12.0–15.0)
MCHC: 31.4 g/dL (ref 30.0–36.0)
MCV: 87 fL (ref 78.0–100.0)
RDW: 13.6 % (ref 11.5–15.5)
WBC: 7.4 10*3/uL (ref 4.0–10.5)

## 2011-08-08 LAB — POCT WET PREP (WET MOUNT)

## 2011-08-08 NOTE — Patient Instructions (Signed)
Your baby sound good. Try to take TUMS (calcium carbonate) tablets for heartburn. Make an appointment in one week for follow up.  Trabajo de parto prematuro,  cuidados en Advice worker (Preterm Labor, Home Care) Se denomina parto prematuro a las contracciones uterinas que causan la apertura (dilatacin), acortamiento y afinamiento del cuello del tero, antes de las 37 semanas de [place]. Es la mayor causa de ingreso de mujeres embarazadas al hospital. [place] causas del [place] prematuro son:  Katha Hamming de los casos se desencadena por motivos desconocidos.   Pequeas zonas de separacin de la placenta (abrupcin).   Polihidramnios (lquido en exceso en el saco amnitico).   Embarazo de gemelos o ms bebs.   Cerviz incompetente (no puede contener al beb debido a que el tejido es demasiado dbil).   Cambios hormonales.   Hemorragia vaginal en ms de uno de los trimestres.   Infeccin en el crvix, la vagin o la vejiga   El consumo de cigarrillos.   Sindrome antifosfolipdico Se produce cuando los anticuerpos afectan las protenas del organismo.  DIAGNSTICO Los factores que ayudan a predecir el trabajo de parto prematuro son:  Historias de Proofreader con trabajo de parto prematuro.   Vaginosis bacteriana en las mujeres que tuvieron trabajo de [place].   Monitoreo de la actividad uterina que demuestre contracciones uterinas.   La protena fibronectina fetal est elevada en mujeres con historia previa del trastorno.   La medicin del largo del crvix con tcnicas de ultrasonido muestra signos de acortamiento antes de la fecha [place].   Realizando una evaluacin conjunta con fibronectina y ecografa cervical, se puede predecir mejor un parto prematuro inminente.   Otros factores de riesgo son:   No pertenecer a Biomedical engineer.   Embarazo a los 17 aos o [address]   Embarazo a los 35 aos o ms.   Nivel socioeconmico bajo.   Aumento de  peso deficiente durante el [employer].  PREVENCIN No todos los partos prematuros pueden prevenirse. Algunas contracciones prematuras pueden prevenirse con medidas simples.  Mantngase bien hidratada. Beba 8 vasos de Warehouse manager. Esto disminuye la posibilidad del parto prematuro. El porcentaje de partos prematuros aumenta en los meses de verano. La deshidratacin hace que el volumen de sangre disminuya. Esto aumenta la concentracin de oxitocina (la hormona que produce las contracciones uterinas) en la [place]. La hidratacin ayuda a prevenir este incremento.   Observe si se presentan signos de infeccin. Estos signos incluyen la sensacin de ardor o el aumento de la necesidad de Geographical information systems officer, secrecin vaginal anormal o el aumento de temperatura sin causa aparente. Esto puedo Engineer, drilling.   Concurra puntualmente a las citas con el profesional que la asiste. Comunquese inmediatamente con el mdico si siente contracciones uterinas.   Busque asesoramiento mdico si tiene preguntas o surge algn problema. Es mejor hacerle las preguntas al profesional que tener un trabajo de parto prematuro sin TEFL teacher.  MANEJO DEL PARTO [employer], DENTRO Y [facility] Hay varios cosas para controlar durante el parto prematuro. Entre ellas se incluyen tanto las medidas mdicas como el cuidado personal para usted y su beb. Generalmente este problema se trata en el hospital. Algunas medidas pueden ser de gran ayuda en el parto prematuro:  Hidratacin (oral o intravenosa) Beba 8 vasos de Warehouse manager.   Haga reposo en cama (en su casa o en el hospital) Puede resultarle de gran ayuda recostarse sobre el lado izquierdo.   Evite las [employer]  sexuales y los orgasmos.   Algunos medicamentos (antibiticos) la ayudarn en la prevencin de infecciones. Corre ms riesgos si ha roto la bolsa o si las contracciones estn causadas por una infeccin [employer] medicamentos como se le indic.   Evaluacin del  beb. Estas pruebas ayudarn al profesional a saber si el beb se encuentra en buen estado y lo que Media planner en caso que se produzca el nacimiento de [place] anticipada. Entre ella se incluyen:   El perfil biofsico   Pruebas de estrs y no estrs.   Amniocentesis, para evaluar la madurez pulmonar del feto.   ndice del volumen del lquido amnitico.   Prueba de [place].   Hay medicamentos que ayudan a que los pulmones del beb se desarrollen ms rpidamente. Esto puede suceder si el parto prematuro no puede detenerse.   El profesional podr darle otros consejos acerca de la preparacin en caso de un nacimiento prematuro. Puede ser de ayuda si es necesario administrar corticoides para ayudar a la maduracin de los pulmones del beb.   Los tocolticos (frmacos que ayudan a TEFL teacher las contracciones uterinas) pueden ayudar a Veterinary surgeon [address]  [address]   En algunos casos es beneficiosa la administracin de progesterona.  TRATAMIENTO El mejor tratamiento es la prevencin, Solicitor los factores de riesgo y la deteccin temprana. Asegrese de Science writer con el profesional cules son los signos y los sntomas (qu puede ocurrir) del Sport and exercise psychologist, especialmente si ha sufrido un trabajo de parto prematuro en un embarazo previo. INSTRUCCIONES PARA EL CUIDADO DOMICILIARIO:  Consuma una dieta balanceada y nutritiva.   Tome las vitaminas como se le indic.   Beba entre 6 y 8 vasos de lquidos por Futures trader.   Descanse y duerma lo suficiente.   No tenga relaciones sexuales si tiene un trabajo de parto prematuro o tiene alto riesgo de tenerlo.   Siga las recomendaciones de su mdico con respecto a las actividades, [address], anlisis de [place] y otros exmenes (ecografas, amniocentesis).   Evite el estrs.   Evite los trabajos extenuantes o la actividad fsica prolongada si tiene riesgo elevado de parto prematuro.   No fume.  SOLICITE ATENCIN MDICA DE INMEDIATO SI:  Tiene  contracciones.   Siente dolor abdominal.   Presenta una hemorragia vaginal abundante.   Siente dolor al [employer].   Observa una hemorragia vaginal anormal.   La temperatura se eleva por encima de 102 F (38,9 C).  Document Released: 07/30/2005 Document Re-Released: 08/17/2009 [facility] Patient Information 2011 [place], [place].

## 2011-08-08 NOTE — Progress Notes (Signed)
35.6 wk ROB visit. Interpretor presetn. No complaints, feels healthy today. Began having intermittent mild ctxs 3 days ago. Occur q4-8 hours. No LOF, bleeding, vaginal DC, decline in fetal activity, dysuria, fever. No hx preterm labor. Reviewed [place] and dates were 3 weeks farther along than LMP.  SVE: fingertip/thick/high. Moderate white discharge. Uterus/cervix nontender. No lesions noted.  A/P; 35 yo G5P4004 at 35.6 weeks by 3rd trimester [place]. Intermitent ctx but closed cervix. Will check wet prep, GC/Ch to r/o cause of preterm labor. Discussed PTL precautions. Obtained GBS ctx today, repeat lab work. Will f/u in one week for ROB visit.

## 2011-08-09 LAB — RPR

## 2011-08-09 LAB — GC/CHLAMYDIA PROBE AMP, GENITAL: GC Probe Amp, Genital: NEGATIVE

## 2011-08-18 ENCOUNTER — Ambulatory Visit (INDEPENDENT_AMBULATORY_CARE_PROVIDER_SITE_OTHER): Payer: Self-pay | Admitting: Family Medicine

## 2011-08-18 DIAGNOSIS — Z331 Pregnant state, incidental: Secondary | ICD-10-CM

## 2011-08-18 NOTE — Progress Notes (Signed)
37.2 wk ROB visit. No complaints, interpretor present today. Complains of some mild itching all over body. Denies rash, abdominal pain, n/v. Has some mild pubic pressure and infrequent contractions. Plans to breast feed, undecided regarding pediatrician.   A/P: 35 yo G5P4004 at 37.2 weeks. No signs labor currently. GBS negative. F/u in one week for ROB.

## 2011-08-18 NOTE — Patient Instructions (Signed)
Make an appointment in one week for check up.  Psychiatrist Glass blower/designer trimestre) (Pregnancy - Third Trimester) El tercer trimestre del embarazo (los ltimos 3 meses) es el perodo de cambios ms rpidos que atraviesan usted y el beb. El aumento de peso es ms rpido. El beb alcanza un largo de aproximadamente 50 cm (20 pulgadas) y pesa entre 2,700 y 4,500 kg (6 a 10 libras). El beb gana ms tejido graso y ya est listo para la vida fuera del cuerpo de la [place]. Mientras estn en el interior, los bebs tienen perodos de sueo y vigilia, Warehouse manager y tienen hipo. Quizs sienta pequeas contracciones del tero. Este es el falso trabajo de [place]. Tambin se las conoce como contracciones de [place]. Es como una prctica del parto. Los problemas ms habituales de esta etapa del embarazo incluyen mayor dificultad para respirar, hinchazn de las manos y los pies por retencin de lquidos y la necesidad de Geographical information systems officer con ms frecuencia debido a que el tero y el beb presionan sobre la vejiga.  EXAMENES PRENATALES  Durante los [employer], deber seguir realizando pruebas de [place], segn avance el [place]. Estas pruebas se realizan para controlar su salud y la del beb. Tambin se realizan anlisis de sangre para [employer] niveles de [place]. La anemia (bajo nivel de hemoglobina) es frecuente durante el embarazo. Para prevenirla, se administran hierro y vitaminas. Tambin le harn nuevas pruebas para descartar la diabetes. Podrn repetirle algunas de las [employer]ov[employer] Enterprises hicieron previamente.   En cada visita le medirn el tamao del tero. Es para asegurarse de que el beb se desarrolla correctamente.   Tambin en cada visita la pesarn. Esto se realiza para asegurarse de que aumenta de peso al ritmo indicado y que usted y su beb evolucionan normalmente.   En algunas ocasiones se realiza una ecografa para confirmar el correcto desarrollo y evolucin del beb. Esta prueba se realiza con  ondas sonoras inofensivas para el beb, de modo que el profesional pueda calcular con ms precisin la fecha del [place].   Discuta las posibilidades de la anestesia si necesita cesrea.  Algunas veces se realizan pruebas especializadas del lquido amnitico que rodea al beb. Esta prueba se denomina amniocentesis. El lquido amnitico se obtiene introduciendo una aguja en el abdomen (vientre). En ocasiones se lleva a cabo cerca del final del embarazo, si es Optician, dispensing. En este caso se realiza para asegurarse de que los pulmones del beb estn lo suficientemente maduros como para que pueda vivir fuera del tero. CAMBIOS QUE OCURREN EN EL TERCER TRIMESTRE DEL EMBARAZO Su organismo atravesar diferentes cambios durante el embarazo que varan de [name] persona a Educational psychologist. Converse con el profesional que la asiste acerca los cambios que usted note y que la preocupen.  Durante el ltimo trimestre probablemente sienta un aumento del apetito. Es normal tener "antojos" de Development worker, community. Esto vara de [name] persona a otra y de un embarazo a Therapist, art.   Podrn aparecer las primeras estras en las caderas, abdomen y [place]. Estos son cambios normales del cuerpo durante el [place]. No existen medicamentos ni ejercicios que puedan prevenir [employer].   El estreimiento puede tratarse con un laxante o agregando fibra a su dieta. Beber grandes cantidades de lquidos, tomar fibras en forma de verduras, frutas y granos integrales es de [place].   Tambin es beneficioso practicar actividad fsica. Si ha sido una persona Engineer, mining, podr continuar con la [employer]son de las actividades durante el  mismo. Si ha sido menos [place], puede ser beneficioso que comience con un programa de ejercicios, Museum/gallery exhibitions officer. Consulte con el profesional que la asiste antes de comenzar un programa de ejercicios.   Evite el consumo de cigarrillos, el alcohol, los medicamentos no prescritos y las "drogas de la  calle" durante el [place]. Estas sustancias qumicas afectan la formacin y el desarrollo del beb. Evite estas sustancias durante todo el embarazo para asegurar el nacimiento de un beb sano.   Dolor de espalda, venas varicosas y hemorroides podran aparecer o empeorar.   Los movimientos del beb pueden ser ms bruscos y aparecer ms a menudo.   Puede que note dificultades para respirar facilmente.   El ombligo podra salrsele hacia afuera.   Puede segregar un lquido amarillento (calostro) de las [place].   Puede segregar mucus con sangre. Esto normalmente ocurre unos [address] a una semana antes de que comience el [place] de [place].  INSTRUCCIONES PARA EL CUIDADO DOMICILIARIO  La mayor parte de los cuidados que se aconsejan son los mismos que los indicados para las primeras etapas del Psychiatrist. Es importante que concurra a todas las citas con el profesional y siga sus instrucciones con Camera operator a los medicamentos que deba Chemical engineer, a la actividad fsica y a Psychologist, forensic.   Durante el embarazo debe obtener nutrientes para usted y para su beb. Consuma alimentos balanceados a intervalos regulares. Elija alimentos como carne, pescado, [place] y otros productos lcteos descremados, verduras, frutas, panes integrales y cereales. El Equities trader cul es el aumento de peso ideal.   Las relaciones sexuales pueden continuarse hasta casi el final del embarazo, si no se presentan otros problemas como prdida prematura (antes de tiempo) de lquido amnitico, hemorragia vaginal o dolor abdominal (en el vientre).   Realice [employer], si no tiene restricciones. Consulte con el profesional que la asiste si no sabe con certeza si determinados ejercicios son seguros. El mayor aumento de peso se produce [employer]ocker ltimos trimestres del [place].   Haga reposo con frecuencia, con las piernas elevadas, o segn lo necesite para evitar los calambres y el dolor de cintura.   Use un  buen sostn o como los que se usan para hacer deportes para Paramedic la sensibilidad de las [place]. Tambin puede serle til si lo [place] mientras duerme. Si pierde Product manager, podr Parker Hann[employer]in.   No utilice la baera con agua caliente, baos turcos y saunas.   Colquese el cinturn de seguridad cuando conduzca. Este la proteger a usted y al beb en caso de accidente.   Evite comer carne cruda y el contacto con los utensilios y desperdicios de los gatos. Estos elementos contienen grmenes que pueden causar defectos de nacimiento en el beb.   Es fcil perder algo de orina durante el [place]. Apretar y Chief Operating Officer los msculos de la pelvis la ayudar con este problema. Practique detener la miccin cuando est en el bao. Estos son los mismos msculos que Development worker, international aid. Son [employer] Partners mismos msculos que utiliza cuando trata de Ryder System gases. Puede practicar apretando estos msculos WellPoint, y repetir esto tres veces por da aproximadamente. Una vez que conozca qu msculos debe contraer, no realice estos ejercicios durante la miccin. Puede favorecerle una infeccin si la orina vuelve hacia atrs.   Pida ayuda si tiene necesidades econmicas, de asesoramiento o nutricionales durante el [place]. El profesional podr ayudarla con respecto a estas necesidades, o derivarla a otros especialistas.  Practique la ida [employer] hospital a modo de [place].   Tome clases prenatales junto con su pareja para comprender, practicar y hacer preguntas acerca del Aleen Campi de parto y el nacimiento.   Prepare la habitacin del beb.   No viaje fuera de la ciudad a menos que sea absolutamente necesario y con el consejo del mdico.   Use slo zapatos bajos sin taco para tener un mejor equilibrio y prevenir cadas.  EL CONSUMO DE MEDICAMENTOS Y DROGAS DURANTE EL EMBARAZO  Contine tomando las vitaminas apropiadas para esta etapa tal como se le indic. Las vitaminas deben contener un  miligramo de cido flico y deben suplementarse con hierro. Guarde todas las vitaminas fuera del alcance de los nios. La ingestin de slo un par de vitaminas o comprimidos que contengan hierro pueden ocasionar la Newmont Mining en un beb o en un nio pequeo.   Evite el uso de [place], inclusive los de venta [place], que no hayan sido prescritos o indicados por el profesional que la asiste. Algunos medicamentos pueden causar problemas fsicos al beb. Utilice los medicamentos de venta libre o de prescripcin para Chief Technology Officer, Environmental health practitioner o la [place], segn se lo indique el profesional que lo asiste. No utilice aspirina, ibuprofeno (Motrin, Advil, Nuprin) o naproxeno (Aleve) a menos que el profesional la autorice.   El alcohol se asocia a cierto nmero de defectos del nacimiento, incluido el sndrome de alcoholismo fetal. Debe evitar el consumo de alcohol en cualquiera de sus formas. El cigarrillo causa nacimientos prematuros y bebs de bajo peso al nacer. Las drogas de la calle son muy nocivas para el beb y estn absolutamente prohibidas. Un beb que nace de [employer] Express, ser adicto al nacer. Ese beb tendr los mismos sntomas de abstinencia que un adulto.   Infrmele al profesional si consume alguna droga.  SOLICITE ATENCIN MDICA SI: Tiene alguna preocupacin Academic librarian. Es mejor que llame para formular las preguntas si no puede esperar hasta la prxima visita, que sentirse preocupada por ellas.  DECISIONES ACERCA DE LA CIRCUNCISIN Usted puede saber o no cul es el sexo de su beb. Si es un varn, ste es el momento de pensar acerca de la circuncisin. La circuncisin es la extirpacin del prepucio. Esta es la piel que cubre el extremo sensible del pene. No hay un motivo mdico que lo justifique. Generalmente la decisin se toma segn lo que sea popular en ese momento, o se basa en creencias religiosas. Podr conversar estos temas con el profesional que la asiste. SOLICITE ATENCIN  MDICA DE INMEDIATO SI:  La temperatura oral se eleva sin motivo por encima de 101 o segn le indique el profesional que la asiste.   Tiene una prdida de lquido por la vagina (canal de parto). Si sospecha una ruptura de las [place], tmese la temperatura y llame al profesional para informarlo sobre esto.   Observa unas pequeas manchas, una hemorragia vaginal o elimina cogulos. Avsele al profesional acerca de la cantidad y de cuntos apsitos est utilizando.   Presenta un olor desagradable en la secrecin vaginal y observa un cambio en el color, de transparente a blanco.   Ha vomitado durante ms de 24 horas.   Presenta escalofros o fiebre.   Comienza a sentir falta de aire.   Siente ardor al Beatrix Shipper.   [place] o sube ms de 900 g (ms de 2 libras), o segn lo indicado por el profesional que la asiste. Observa que sbitamente se le Southwest Airlines, las  manos, los pies o las piernas.   Presenta dolor abdominal. Las molestias en el ligamento redondo son [name] causa benigna (no cancerosa) frecuente de Engineer, mining abdominal durante el Psychiatrist, pero el profesional que la asiste deber evaluarlo.   Presenta dolor de cabeza intenso que no se [place].   Si no siente los movimientos del beb durante ms de tres horas. Si piensa que el beb no se mueve tanto como lo haca habitualmente, coma algo que Psychologist, clinical y Target Corporation lado izquierdo durante [place]. El beb debe moverse al menos 4  5 veces por hora. Comunquese inmediatamente si el beb se mueve menos que lo indicado.   Se cae, se ve involucrada en un accidente automovilstico o sufre algn tipo de traumatismo.   En su hogar hay violencia mental o fsica.  Document Released: 07/30/2005 Document Re-Released: 08/17/2009 [facility] Patient Information 2011 [place], [place].

## 2011-08-28 ENCOUNTER — Ambulatory Visit (INDEPENDENT_AMBULATORY_CARE_PROVIDER_SITE_OTHER): Payer: Self-pay | Admitting: *Deleted

## 2011-08-28 ENCOUNTER — Other Ambulatory Visit: Payer: Self-pay | Admitting: Family Medicine

## 2011-08-28 ENCOUNTER — Telehealth: Payer: Self-pay | Admitting: Family Medicine

## 2011-08-28 ENCOUNTER — Ambulatory Visit (INDEPENDENT_AMBULATORY_CARE_PROVIDER_SITE_OTHER): Payer: Self-pay | Admitting: Family Medicine

## 2011-08-28 ENCOUNTER — Telehealth: Payer: Self-pay | Admitting: *Deleted

## 2011-08-28 VITALS — BP 108/67 | Wt 152.0 lb

## 2011-08-28 DIAGNOSIS — L299 Pruritus, unspecified: Secondary | ICD-10-CM

## 2011-08-28 DIAGNOSIS — O368190 Decreased fetal movements, unspecified trimester, not applicable or unspecified: Secondary | ICD-10-CM

## 2011-08-28 DIAGNOSIS — Z331 Pregnant state, incidental: Secondary | ICD-10-CM

## 2011-08-28 LAB — COMPREHENSIVE METABOLIC PANEL
AST: 23 U/L (ref 0–37)
Alkaline Phosphatase: 294 U/L — ABNORMAL HIGH (ref 39–117)
Alkaline Phosphatase: 306 U/L — ABNORMAL HIGH (ref 39–117)
BUN: 6 mg/dL (ref 6–23)
CO2: 23 mEq/L (ref 19–32)
CO2: 21 mEq/L (ref 19–32)
Chloride: 106 mEq/L (ref 96–112)
Creat: 0.55 mg/dL (ref 0.50–1.10)
Creat: 0.6 mg/dL (ref 0.50–1.10)
Glucose, Bld: 92 mg/dL (ref 70–99)
Glucose, Bld: 87 mg/dL (ref 70–99)
Potassium: 3.6 mEq/L (ref 3.5–5.3)
Sodium: 140 mEq/L (ref 135–145)
Sodium: 139 mEq/L (ref 135–145)
Total Bilirubin: 0.5 mg/dL (ref 0.3–1.2)
Total Bilirubin: 0.5 mg/dL (ref 0.3–1.2)

## 2011-08-28 MED ORDER — PRENATAL 27-0.8 MG PO TABS: 1.0000 | ORAL_TABLET | Freq: Every day | ORAL | Status: DC

## 2011-08-28 NOTE — Progress Notes (Signed)
P = 76  NST for decr. FM

## 2011-08-28 NOTE — Progress Notes (Signed)
38.5wk ROB visit. Used language line to discuss as pt did not schedule adopt-a-mom interpretor. Has increased itching allover body, worse at night. No skin rash or discoloration. Denies any abdominal pain, decline in fetal movement. No DC, bleeding. Exam: No gross jaundice or icterus, excoriations on ankles. No rash. SVE: vertex, 1cm/20/-3. No vag discharge. TcB 2.2  A/P; 35 yo G5P4004 at 38.5 with generalized pruritis, concern for cholestasis of pregnancy. Given worsening of pruritis, will check stat CMET and Bile acids. If elevated, will schedule induction. Scheduled NST today to evaluate fetal wellbeing. Discussed with patient if this is cholestasis of pregnancy, small fetal risk and need for close follow up. PATP.

## 2011-08-28 NOTE — Telephone Encounter (Signed)
appt made for pt to go to [facility] to have NST done at 2 pm TODAY!! Pt informed prior to leaving clinic today.Loralee Pacas [place]

## 2011-08-28 NOTE — Patient Instructions (Addendum)
[facility] I will call you if we need to induce labor. If not, we will see you in one week for appointment.  Colestasis del embarazo (Cholestasis of Pregnancy) La colestasis es la suspensin o la restriccin del flujo de bilis desde la vescula biliar al intestino. Es un trastorno poco frecuente. Puede ser leve o grave.Tracey Valdez ocurre durante el ultimo trimester. El trastorno se produce debido a que el hgado no puede eliminar la bilis. La bilis contiene los productos de desecho de los glbulos rojos. Cuando el nivel de estos productos de desecho (bilirrubina) se eleva en el hgado y stos se vierten en la sangre, producen ictericia. Se denomina ictericia al trastorno que ocasiona tono amarillento en la piel. Generalmente Tracey Valdez en el que se observa es en el rea blanca del ojo. A medida que la bilirrubina se eleva, causa picazn en la piel y a veces una erupcin. CAUSAS  Sufrir trastornos en el hgado o fuera del mismo (clculos biliares que obstruyen la abertura de la vescula biliar.   Durante el [name], puede deberse al aumento de estrgenos en la [name].   Puede ser [name] al estrgeno que contienen las pldoras anticonceptivas.   Por consumo de drogas y alcohol.   Por ciertos tipos de anemia (bajo nivel de glbulos rojos) antes del embarazo. Hable con su mdico si tiene anemia.Tracey Valdez ictericia en el embarazo tambin se observa en los casos de toxemia grave, vmitos continuos y abundantes (hiperemesis [name]) e hgado graso agudo del Tracey Valdez.   Otras causas de ictericia que deben descartarse son la hepatitis, enfermedad heptica crnica (cirrosis), hepatitis autoinmune y en la mononucleosis infecciosa grave.  En general, se trata de un trastorno benigno. Sin embargo, puede causar parto premature y Tracey Valdez en peligro la salud del beb. Esta ictericia es similar a la ictericia del recin nacido. Si los niveles de bilirrubina  se elevan demasiado, pueden causar retraso mental en el nio. SNTOMAS  Picazn   Piel amarilla (ictericia)   Erupcin  TRATAMIENTO Este problema desaparece luego del nacimiento del beb. El tratamiento est dirigido a Tracey Valdez. Si el trastorno es grave y la ictericia alcanza niveles peligrosos, podrn inducirle e trabajo de parto o practicarle una intervencin cesrea. Generalmente no produce un Tracey Valdez en el hgado de la Tracey Valdez. Sin embargo, la colestasis puede ocurrir en los embarazos futuros. INSTRUCCIONES PARA EL CUIDADO DOMICILIARIO  No consuma drogas ni alcohol.   No tome ningn medicamento excepto que se lo haya indicado su mdico.   Cumpla con las citas tal como se le indic. Es importante para su salud y la de su beb.   Slo utilice cremas y medicamentos para la picazn que le ha indicado su mdico.  SOLICITE ATENCIN MDICA DE INMEDIATO SI:  La temperatura oral se eleva sin motivo a ms de 102 F (38.9 C) o segn le indique su mdico   No puede controlar los sntomas con los medicamentos o las medidas indicadas por su mdico. Se siente empeorar.   Tiene nuseas, vmitos o dolor abdominal.   Sufre una cefalea grave.   Tiene problemas visuales (visin doble o borrosa).   Tiene contracciones uterinas.   Presenta una hemorragia vaginal abundante.  Document Released: 07/30/2005 Document Revised: 07/02/2011 Tracey Valdez Patient Information 2012 [place], [place].

## 2011-08-28 NOTE — Telephone Encounter (Signed)
[facility]ed [facility] as patient's labs have not yet resulted and ordered stat. Only notable abnormality is alk phos elevated at 305 and low protein. Total bilirubin 0.5. Lab tech states that bile acids test usually takes 3-5 days to result. Patient had NST today. Will plan to repeat this in 3 days and discuss need for induction with OB faculty.

## 2011-08-29 ENCOUNTER — Telehealth: Payer: Self-pay | Admitting: Family Medicine

## 2011-08-29 MED ORDER — HYDROXYZINE HCL 25 MG PO TABS: 25.0000 mg | ORAL_TABLET | Freq: Three times a day (TID) | ORAL | Status: DC | PRN

## 2011-08-29 NOTE — Telephone Encounter (Signed)
DIscussed case with [facility] Dr. Natale Milch who discussed with Attending Dr. Debroah Loop regarding this patient with clinical signs concerning for late onset cholestasis. Thus far CMET, LFTs and TBili are within normal limits. NST yesterday and physical exam are benign. Will await bile acid serum test and if elevated, then schedule induction of labor. Otherwise treat symptomatically for itching.

## 2011-09-01 ENCOUNTER — Telehealth (HOSPITAL_COMMUNITY): Payer: Self-pay | Admitting: Family Medicine

## 2011-09-01 NOTE — Telephone Encounter (Signed)
I tried to call pt but pt does answer the phone I let a VM, I tried also her emergency contact and pt's friends will notify pt that we call her.Pt will call us back

## 2011-09-04 ENCOUNTER — Ambulatory Visit (INDEPENDENT_AMBULATORY_CARE_PROVIDER_SITE_OTHER): Payer: Self-pay | Admitting: Family Medicine

## 2011-09-04 DIAGNOSIS — Z331 Pregnant state, incidental: Secondary | ICD-10-CM

## 2011-09-04 NOTE — Progress Notes (Signed)
Pt here at 39 and 5/7 for follow up visit. Pt reports fetal movement is only once per hour since Monday.  No LOF, No VB, +irreg ctx.  Still with itching. Same as before. Notes a contraction in clinic.   Pt to go to [facility] for decreased fetal movement.  Has no one to drive her there, but feels fine and will go right now and have friends or family members meet her there to care for her 53 month old, who is with her now. Pt reports she has had her flu shot already this year.

## 2011-09-04 NOTE — Patient Instructions (Signed)
Please go to [facility] now.  They are expecting you. If they send you home from there, please come back and see Dr. Cristal Ford in 1 week. Please count and make sure your baby is moving at least 5 times in 1 hour, or 10 times in 2 hours. If you go home today after you go to [facility], be sure to go back to the hospital if you break your water, if you have bleeding, if you have contractions that happen every 5 minutes for 2 hours, or if your baby is not moving well. Por favor vaya al [facility] ahora. Le estn esperando.Si te mandan a casa desde all, por favor vuelva a ver Dr. Cristal Ford en 1 semana.Por favor, cuente y asegrese de que su beb se est moviendo menos 5 veces en 1 hora, o 10 veces en 2 horas.Si vas a casa hoy despus de ir al [facility], asegrese de volver al hospital si te rompes el agua, si tiene sangrado, si tiene contracciones que ocurren cada 5 minutos durante 2 horas, o si su beb no est [employer].

## 2011-09-05 ENCOUNTER — Inpatient Hospital Stay (HOSPITAL_COMMUNITY): Payer: Medicaid Other | Admitting: Anesthesiology

## 2011-09-05 ENCOUNTER — Encounter (HOSPITAL_COMMUNITY): Payer: Self-pay | Admitting: *Deleted

## 2011-09-05 ENCOUNTER — Encounter (HOSPITAL_COMMUNITY): Payer: Self-pay | Admitting: Anesthesiology

## 2011-09-05 ENCOUNTER — Encounter (HOSPITAL_COMMUNITY): Payer: Self-pay | Admitting: Family Medicine

## 2011-09-05 ENCOUNTER — Inpatient Hospital Stay (HOSPITAL_COMMUNITY)
Admission: AD | Admit: 2011-09-05 | Discharge: 2011-09-06 | DRG: 775 | Disposition: A | Discharge status: Home / Self Care | Payer: Medicaid Other | Source: Ambulatory Visit | Attending: Obstetrics & Gynecology | Admitting: Obstetrics & Gynecology

## 2011-09-05 DIAGNOSIS — Z3490 Encounter for supervision of normal pregnancy, unspecified, unspecified trimester: Secondary | ICD-10-CM

## 2011-09-05 DIAGNOSIS — O09529 Supervision of elderly multigravida, unspecified trimester: Principal | ICD-10-CM | POA: Diagnosis present

## 2011-09-05 LAB — CBC
HCT: 35.4 % — ABNORMAL LOW (ref 36.0–46.0)
Hemoglobin: 11.6 g/dL — ABNORMAL LOW (ref 12.0–15.0)
MCH: 27 pg (ref 26.0–34.0)
MCHC: 32.8 g/dL (ref 30.0–36.0)

## 2011-09-05 LAB — ABO/RH: ABO/RH(D): O POS

## 2011-09-05 MED ORDER — EPHEDRINE 5 MG/ML INJ
10.0000 mg | INTRAVENOUS | Status: DC | PRN
  Filled 2011-09-05: qty 4

## 2011-09-05 MED ORDER — ACETAMINOPHEN 325 MG PO TABS: 650.0000 mg | ORAL_TABLET | ORAL | Status: DC | PRN

## 2011-09-05 MED ORDER — METHYLERGONOVINE MALEATE 0.2 MG/ML IJ SOLN: 0.2000 mg | INTRAMUSCULAR | Status: DC | PRN

## 2011-09-05 MED ORDER — ONDANSETRON HCL 4 MG/2ML IJ SOLN: 4.0000 mg | Freq: Four times a day (QID) | INTRAMUSCULAR | Status: DC | PRN

## 2011-09-05 MED ORDER — EPHEDRINE 5 MG/ML INJ
10.0000 mg | INTRAVENOUS | Status: DC | PRN
  Filled 2011-09-05 (×2): qty 4

## 2011-09-05 MED ORDER — DIPHENHYDRAMINE HCL 50 MG/ML IJ SOLN: 12.5000 mg | INTRAMUSCULAR | Status: DC | PRN

## 2011-09-05 MED ORDER — SENNOSIDES-DOCUSATE SODIUM 8.6-50 MG PO TABS
2.0000 | ORAL_TABLET | Freq: Every day | ORAL | Status: DC
  Administered 2011-09-05: 2 via ORAL

## 2011-09-05 MED ORDER — OXYTOCIN BOLUS FROM INFUSION
500.0000 mL | Freq: Once | INTRAVENOUS | Status: DC
  Filled 2011-09-05: qty 1000
  Filled 2011-09-05: qty 500

## 2011-09-05 MED ORDER — ONDANSETRON HCL 4 MG/2ML IJ SOLN: 4.0000 mg | INTRAMUSCULAR | Status: DC | PRN

## 2011-09-05 MED ORDER — OXYTOCIN 20 UNITS IN LACTATED RINGERS INFUSION - SIMPLE
1.0000 m[IU]/min | INTRAVENOUS | Status: DC
  Administered 2011-09-05: 1 m[IU]/min via INTRAVENOUS
  Filled 2011-09-05: qty 1000

## 2011-09-05 MED ORDER — TERBUTALINE SULFATE 1 MG/ML IJ SOLN: 0.2500 mg | Freq: Once | INTRAMUSCULAR | Status: AC | PRN

## 2011-09-05 MED ORDER — CITRIC ACID-SODIUM CITRATE 334-500 MG/5ML PO SOLN: 30.0000 mL | ORAL | Status: DC | PRN

## 2011-09-05 MED ORDER — OXYTOCIN 20 UNITS IN LACTATED RINGERS INFUSION - SIMPLE
125.0000 mL/h | Freq: Once | INTRAVENOUS | Status: DC
  Administered 2011-09-05: 125 mL/h via INTRAVENOUS

## 2011-09-05 MED ORDER — PRENATAL PLUS 27-1 MG PO TABS
1.0000 | ORAL_TABLET | Freq: Every day | ORAL | Status: DC
  Administered 2011-09-06: 1 via ORAL
  Filled 2011-09-05: qty 1

## 2011-09-05 MED ORDER — IBUPROFEN 600 MG PO TABS
600.0000 mg | ORAL_TABLET | Freq: Four times a day (QID) | ORAL | Status: DC
  Administered 2011-09-05 – 2011-09-06 (×4): 600 mg via ORAL
  Filled 2011-09-05 (×3): qty 1

## 2011-09-05 MED ORDER — LIDOCAINE HCL 1.5 % IJ SOLN
INTRAMUSCULAR | Status: DC | PRN
  Administered 2011-09-05 (×2): 4 mL via INTRADERMAL

## 2011-09-05 MED ORDER — LACTATED RINGERS IV SOLN
500.0000 mL | Freq: Once | INTRAVENOUS | Status: AC
  Administered 2011-09-05: 500 mL via INTRAVENOUS

## 2011-09-05 MED ORDER — BENZOCAINE-MENTHOL 20-0.5 % EX AERO
INHALATION_SPRAY | CUTANEOUS | Status: AC
  Administered 2011-09-05: 18:00:00
  Filled 2011-09-05: qty 56

## 2011-09-05 MED ORDER — WITCH HAZEL-GLYCERIN EX PADS: 1.0000 "application " | MEDICATED_PAD | CUTANEOUS | Status: DC | PRN

## 2011-09-05 MED ORDER — ZOLPIDEM TARTRATE 5 MG PO TABS: 5.0000 mg | ORAL_TABLET | Freq: Every evening | ORAL | Status: DC | PRN

## 2011-09-05 MED ORDER — PHENYLEPHRINE 40 MCG/ML (10ML) SYRINGE FOR IV PUSH (FOR BLOOD PRESSURE SUPPORT)
80.0000 ug | PREFILLED_SYRINGE | INTRAVENOUS | Status: DC | PRN
  Filled 2011-09-05: qty 5

## 2011-09-05 MED ORDER — DIBUCAINE 1 % RE OINT: 1.0000 "application " | TOPICAL_OINTMENT | RECTAL | Status: DC | PRN

## 2011-09-05 MED ORDER — BENZOCAINE-MENTHOL 20-0.5 % EX AERO: 1.0000 "application " | INHALATION_SPRAY | CUTANEOUS | Status: DC | PRN

## 2011-09-05 MED ORDER — SIMETHICONE 80 MG PO CHEW: 80.0000 mg | CHEWABLE_TABLET | ORAL | Status: DC | PRN

## 2011-09-05 MED ORDER — FENTANYL 2.5 MCG/ML BUPIVACAINE 1/10 % EPIDURAL INFUSION (WH - ANES)
14.0000 mL/h | INTRAMUSCULAR | Status: DC
  Administered 2011-09-05 (×2): 14 mL/h via EPIDURAL
  Filled 2011-09-05 (×3): qty 60

## 2011-09-05 MED ORDER — HYDROXYZINE HCL 50 MG PO TABS
50.0000 mg | ORAL_TABLET | Freq: Four times a day (QID) | ORAL | Status: DC | PRN
  Filled 2011-09-05: qty 1

## 2011-09-05 MED ORDER — HYDROXYZINE HCL 50 MG/ML IM SOLN
50.0000 mg | Freq: Four times a day (QID) | INTRAMUSCULAR | Status: DC | PRN
  Filled 2011-09-05: qty 1

## 2011-09-05 MED ORDER — NALBUPHINE SYRINGE 5 MG/0.5 ML
5.0000 mg | INJECTION | INTRAMUSCULAR | Status: DC | PRN
  Filled 2011-09-05: qty 0.5

## 2011-09-05 MED ORDER — ONDANSETRON HCL 4 MG PO TABS: 4.0000 mg | ORAL_TABLET | ORAL | Status: DC | PRN

## 2011-09-05 MED ORDER — IBUPROFEN 600 MG PO TABS
600.0000 mg | ORAL_TABLET | Freq: Four times a day (QID) | ORAL | Status: DC | PRN
  Filled 2011-09-05: qty 1

## 2011-09-05 MED ORDER — PHENYLEPHRINE 40 MCG/ML (10ML) SYRINGE FOR IV PUSH (FOR BLOOD PRESSURE SUPPORT)
80.0000 ug | PREFILLED_SYRINGE | INTRAVENOUS | Status: DC | PRN
  Filled 2011-09-05 (×2): qty 5

## 2011-09-05 MED ORDER — LACTATED RINGERS IV SOLN: 500.0000 mL | INTRAVENOUS | Status: DC | PRN

## 2011-09-05 MED ORDER — METHYLERGONOVINE MALEATE 0.2 MG PO TABS: 0.2000 mg | ORAL_TABLET | ORAL | Status: DC | PRN

## 2011-09-05 MED ORDER — LIDOCAINE HCL (PF) 1 % IJ SOLN
30.0000 mL | INTRAMUSCULAR | Status: DC | PRN
  Filled 2011-09-05 (×2): qty 30

## 2011-09-05 MED ORDER — FLEET ENEMA 7-19 GM/118ML RE ENEM: 1.0000 | ENEMA | RECTAL | Status: DC | PRN

## 2011-09-05 MED ORDER — DIPHENHYDRAMINE HCL 25 MG PO CAPS: 25.0000 mg | ORAL_CAPSULE | Freq: Four times a day (QID) | ORAL | Status: DC | PRN

## 2011-09-05 MED ORDER — FENTANYL 2.5 MCG/ML BUPIVACAINE 1/10 % EPIDURAL INFUSION (WH - ANES)
INTRAMUSCULAR | Status: DC | PRN
  Administered 2011-09-05: 14 mL/h via EPIDURAL

## 2011-09-05 MED ORDER — LACTATED RINGERS IV SOLN
INTRAVENOUS | Status: DC
  Administered 2011-09-05 (×3): 125 mL/h via INTRAVENOUS

## 2011-09-05 MED ORDER — FERROUS SULFATE 325 (65 FE) MG PO TABS
325.0000 mg | ORAL_TABLET | Freq: Two times a day (BID) | ORAL | Status: DC
  Administered 2011-09-06: 325 mg via ORAL
  Filled 2011-09-05: qty 1

## 2011-09-05 MED ORDER — LANOLIN HYDROUS EX OINT: TOPICAL_OINTMENT | CUTANEOUS | Status: DC | PRN

## 2011-09-05 MED ORDER — TETANUS-DIPHTH-ACELL PERTUSSIS 5-2.5-18.5 LF-MCG/0.5 IM SUSP: 0.5000 mL | Freq: Once | INTRAMUSCULAR | Status: DC

## 2011-09-05 MED ORDER — OXYCODONE-ACETAMINOPHEN 5-325 MG PO TABS
2.0000 | ORAL_TABLET | ORAL | Status: DC | PRN
  Filled 2011-09-05: qty 1

## 2011-09-05 MED ORDER — OXYCODONE-ACETAMINOPHEN 5-325 MG PO TABS
1.0000 | ORAL_TABLET | ORAL | Status: DC | PRN
  Administered 2011-09-05 – 2011-09-06 (×2): 1 via ORAL
  Filled 2011-09-05: qty 1

## 2011-09-05 NOTE — Anesthesia Preprocedure Evaluation (Signed)
Anesthesia Evaluation  Patient identified by MRN, date of birth, ID band Patient awake    Reviewed: Allergy & Precautions, H&P , Patient's Chart, lab work & pertinent test results  Airway Mallampati: III TM Distance: >3 FB Neck ROM: full    Dental No notable dental hx. (+) Teeth Intact   Pulmonary neg pulmonary ROS,  clear to auscultation  Pulmonary exam normal       Cardiovascular neg cardio ROS regular Normal    Neuro/Psych Negative Neurological ROS  Negative Psych ROS   GI/Hepatic negative GI ROS, Neg liver ROS,   Endo/Other  Negative Endocrine ROS  Renal/GU negative Renal ROS  Genitourinary negative   Musculoskeletal   Abdominal   Peds  Hematology negative hematology ROS (+)   Anesthesia Other Findings   Reproductive/Obstetrics (+) Pregnancy                           Anesthesia Physical Anesthesia Plan  ASA: II  Anesthesia Plan: Epidural   Post-op Pain Management:    Induction:   Airway Management Planned:   Additional Equipment:   Intra-op Plan:   Post-operative Plan:   Informed Consent: I have reviewed the patients History and Physical, chart, labs and discussed the procedure including the risks, benefits and alternatives for the proposed anesthesia with the patient or authorized representative who has indicated his/her understanding and acceptance.     Plan Discussed with: Anesthesiologist  Anesthesia Plan Comments:         Anesthesia Quick Evaluation

## 2011-09-05 NOTE — H&P (Signed)
  Tracey Valdez is a 34 y.o. female [phone] with IUP at [redacted]w[redacted]d presenting for c/o pressure, some contractions. Pt states she has been having irregular, every 10 minutes, associated with none vaginal bleeding, intact, with active.   [facility] at [facility] 28 wks  Prenatal History/Complications:  Past Medical History: Past Medical History  Diagnosis Date  . No pertinent past medical history     Past Surgical History: No past surgical history on file.  Obstetrical History: OB History    Grav Para Term Preterm Abortions TAB SAB Ect Mult Living   5 4 4       4       Gynecological History: OB History    Grav Para Term Preterm Abortions TAB SAB Ect Mult Living   5 4 4       4       Social History: History   Social History  . Marital Status: Single    Spouse Name: N/A    Number of Children: N/A  . Years of Education: N/A   Social History Main Topics  . Smoking status: Current Some Day Smoker -- 0.2 packs/day    Types: Cigarettes  . Smokeless tobacco: Not on file  . Alcohol Use: No  . Drug Use: No  . Sexually Active: Yes   Other Topics Concern  . Not on file   Social History Narrative  . No narrative on file    Family History: Family History  Problem Relation Age of Onset  . Diabetes Mother   . Diabetes Sister     Allergies: No Known Allergies  Prescriptions prior to admission  Medication Sig Dispense Refill  . hydrOXYzine (ATARAX/VISTARIL) 25 MG tablet Take 1 tablet (25 mg total) by mouth 3 (three) times daily as needed for itching.  30 tablet  0  . Prenatal Vit-Fe Fumarate-FA (MULTIVITAMIN-PRENATAL) 27-0.8 MG TABS Take 1 tablet by mouth daily.  120 each  2    Review of Systems - Negative    Blood pressure 123/63, pulse 85, temperature 97.8 F (36.6 C), temperature source Oral, resp. rate 20, height 5\' 3"  (1.6 m), weight 68.663 kg (151 lb 6 oz), last menstrual period 12/21/2010, unknown if currently breastfeeding. General appearance: alert,  cooperative and no distress Lungs: clear to auscultation bilaterally Heart: regular rate and rhythm Abdomen: soft, non-tender; bowel sounds normal Pelvic:now 5/50/02 Extremities: Homans sign is negative, no sign of DVT DTR's 2+ Presentation: cephalic Baseline: 150 bpm, avg LTV, + accels, no decels Contractions mild and irregular, q7- 10 minutes     Prenatal labs: ABO, Rh: O/POS/-- (07/16 1341) Antibody: NEG (07/16 1341) Rubella:  immune RPR: NON REAC (10/05 1109)  HBsAg: NEGATIVE (07/16 1341)  HIV: NON REACTIVE (10/05 1109)  GBS: Negative (10/08 0919)  1 hr Glucola 147;  3 hr 90/161/100/125 Genetic screening  n/a Anatomy US normal   Assessment: Tracey Valdez is a 35 y.o. [id number] with an IUP at [redacted]w[redacted]d presenting for labor  Plan: Expectant management

## 2011-09-05 NOTE — Progress Notes (Signed)
Tracey Valdez is a 35 y.o. [id number] at [redacted]w[redacted]d by ultrasound admitted for active labor  Subjective: Patient sleeping, no pain or complaints. Pit at 10/min  Objective: BP 132/72  Pulse 71  Temp(Src) 97.9 F (36.6 C) (Oral)  Resp 18  Ht 5\' 3"  (1.6 m)  Wt 68.663 kg (151 lb 6 oz)  BMI 26.81 kg/m2  SpO2 99%  LMP 12/21/2010  Breastfeeding? Unknown   Total I/O In: -  Out: 100 [Urine:100]  FHT:  FHR: 120-130 bpm, variability: moderate,  accelerations:  Present,  decelerations:  Absent UC:   regular, every 2-4 minutes SVE:  Dilation: 8 Effacement (%): 60 Cervical Position: Middle Station: -1 Presentation: Vertex Exam by:: Dr. Cristal Ford   Labs: Lab Results  Component Value Date   WBC 7.0 09/05/2011   HGB 11.6* 09/05/2011   HCT 35.4* 09/05/2011   MCV 82.3 09/05/2011   PLT 228 09/05/2011    Assessment / Plan: Augmentation of labor, progressing well  Labor: Progressing on Pitocin, AROM.  Fetal Wellbeing:  Category I Pain Control:  Epidural I/D:  n/a Anticipated MOD:  NSVD  Tracey Valdez 09/05/2011, 1:10 PM

## 2011-09-05 NOTE — Anesthesia Procedure Notes (Signed)
Epidural Patient location during procedure: OB Start time: 09/05/2011 8:08 AM  Staffing Anesthesiologist: Malen Gauze, Manjot Beumer A. Performed by: anesthesiologist   Preanesthetic Checklist Completed: patient identified, site marked, surgical consent, pre-op evaluation, timeout performed, IV checked, risks and benefits discussed and monitors and equipment checked  Epidural Patient position: sitting Prep: site prepped and draped and DuraPrep Patient monitoring: continuous pulse ox and blood pressure Approach: midline Injection technique: LOR air  Needle:  Needle type: Tuohy  Needle gauge: 17 G Needle length: 9 cm Needle insertion depth: 5 cm cm Catheter type: closed end flexible Catheter size: 19 Gauge Catheter at skin depth: 10 cm Test dose: negative and 1.5% lidocaine  Assessment Events: blood not aspirated, injection not painful, no injection resistance, negative IV test and no paresthesia  Additional Notes Patient is more comfortable after epidural dosed. Please see RN's note for documentation of vital signs and FHR which are stable.

## 2011-09-05 NOTE — ED Provider Notes (Signed)
Tracey Valdez is a 35 y.o. female [phone] with IUP at [redacted]w[redacted]d presenting for c/o pressure, some contractions. Pt states she has been having irregular, every 10 minutes, associated with none vaginal bleeding, intact, with active.   [facility]are at [facility] since 28 wks  Prenatal History/Complications:  Past Medical History: Past Medical History  Diagnosis Date  . No pertinent past medical history     Past Surgical History: No past surgical history on file.  Obstetrical History: OB History    Grav Para Term Preterm Abortions TAB SAB Ect Mult Living   5 4 4       4       Gynecological History: OB History    Grav Para Term Preterm Abortions TAB SAB Ect Mult Living   5 4 4       4       Social History: History   Social History  . Marital Status: Single    Spouse Name: N/A    Number of Children: N/A  . Years of Education: N/A   Social History Main Topics  . Smoking status: Current Some Day Smoker -- 0.2 packs/day    Types: Cigarettes  . Smokeless tobacco: Not on file  . Alcohol Use: No  . Drug Use: No  . Sexually Active: Yes   Other Topics Concern  . Not on file   Social History Narrative  . No narrative on file    Family History: Family History  Problem Relation Age of Onset  . Diabetes Mother   . Diabetes Sister     Allergies: No Known Allergies  Prescriptions prior to admission  Medication Sig Dispense Refill  . hydrOXYzine (ATARAX/VISTARIL) 25 MG tablet Take 1 tablet (25 mg total) by mouth 3 (three) times daily as needed for itching.  30 tablet  0  . Prenatal Vit-Fe Fumarate-FA (MULTIVITAMIN-PRENATAL) 27-0.8 MG TABS Take 1 tablet by mouth daily.  120 each  2    Review of Systems - Negative    Blood pressure 123/63, pulse 85, temperature 97.8 F (36.6 C), temperature source Oral, resp. rate 20, height 5\' 3"  (1.6 m), weight 68.663 kg (151 lb 6 oz), last menstrual period 12/21/2010, unknown if currently breastfeeding. General appearance: alert, cooperative  and no distress Lungs: clear to auscultation bilaterally Heart: regular rate and rhythm Abdomen: soft, non-tender; bowel sounds normal Pelvic: 4/50/-2 (Pt was 3cms in clinic today) Extremities: Homans sign is negative, no sign of DVT DTR's 2+ Presentation: cephalic Baseline: 150 bpm, avg LTV, + accels, no decels Contractions mild and irregular, q7- 10 minutes     Prenatal labs: ABO, Rh: O/POS/-- (07/16 1341) Antibody: NEG (07/16 1341) Rubella:  immune RPR: NON REAC (10/05 1109)  HBsAg: NEGATIVE (07/16 1341)  HIV: NON REACTIVE (10/05 1109)  GBS: Negative (10/08 0919)  1 hr Glucola 147;  3 hr 90/161/100/125 Genetic screening  n/a Anatomy US normal   Assessment: Tracey Valdez is a 35 y.o. [id number] with an IUP at [redacted]w[redacted]d presenting for r/o labor  Plan: Pt will stay for an hour or so to see if contractions increase   TraceyDeronte Valdez 09/05/2011, 12:52 AM

## 2011-09-05 NOTE — Progress Notes (Signed)
WITH INTERPRETER- DEBBIE

## 2011-09-05 NOTE — Progress Notes (Signed)
SAYS HURT BAD AT  8 PM - AND PRESSURE. VE IN CLINIC-  TODAY AT 3 CM.   DENIES HSV AND MRSA.

## 2011-09-05 NOTE — Progress Notes (Signed)
[facility] Department of Clinical Social Work Documentation of Interpretation   I assisted __Dr Foster_________________ with interpretation of __Epidural____________________ for this patient.

## 2011-09-05 NOTE — Progress Notes (Signed)
Tracey Valdez is a 35 y.o. [id number] at [redacted]w[redacted]d by ultrasound admitted for active labor  Subjective: Epidural in place. Pain controlled. NO complaints.   Objective: BP 124/81  Pulse 79  Temp(Src) 98 F (36.7 C) (Oral)  Resp 20  Ht 5\' 3"  (1.6 m)  Wt 68.663 kg (151 lb 6 oz)  BMI 26.81 kg/m2  SpO2 99%  LMP 12/21/2010  Breastfeeding? Unknown   Total I/O In: -  Out: 100 [Urine:100]  FHT:  FHR: 120s bpm, variability: moderate,  accelerations:  Present,  decelerations:  Absent UC:   regular, every 4-7 minutes SVE:   Dilation: 6.5 Effacement (%): 80 Station: -1 Exam by:: Dr. Cristal Ford  Labs: Lab Results  Component Value Date   WBC 7.0 09/05/2011   HGB 11.6* 09/05/2011   HCT 35.4* 09/05/2011   MCV 82.3 09/05/2011   PLT 228 09/05/2011    Assessment / Plan: Protracted active phase  Labor: Plan to start augmentation with pitocin Preeclampsia:  0 Fetal Wellbeing:  Category I Pain Control:  Epidural I/D:  n/a Anticipated MOD:  NSVD  Jshawn Hurta 09/05/2011, 10:00 AM

## 2011-09-05 NOTE — Progress Notes (Signed)
   Tracey Valdez is a 35 y.o. [id number] at [redacted]w[redacted]d  admitted for active labor  Subjective: Feels like contractions have spaced out  Objective: BP 131/73  Pulse 71  Temp(Src) 97.7 F (36.5 C) (Oral)  Resp 16  Ht 5\' 3"  (1.6 m)  Wt 68.663 kg (151 lb 6 oz)  BMI 26.81 kg/m2  LMP 12/21/2010  Breastfeeding? Unknown    FHT:  FHR: 130 bpm, variability: moderate,  accelerations:  Present,  decelerations:  Absent UC:   irregular, every 2-5 minutes SVE:   Dilation: 6 Effacement (%): 50 Station: -2 Exam by:: Fran,CNM  Labs: Lab Results  Component Value Date   WBC 7.0 09/05/2011   HGB 11.6* 09/05/2011   HCT 35.4* 09/05/2011   MCV 82.3 09/05/2011   PLT 228 09/05/2011    Assessment / Plan: Protracted latent phase; AROM with clear fluid  Labor: protracted Fetal Wellbeing:  Category I Pain Control:  Labor support without medications Anticipated MOD:  NSVD  Valdez,Tracey Moyd 09/05/2011, 7:16 AM

## 2011-09-05 NOTE — Progress Notes (Signed)
[facility] Department of Clinical Social Work Documentation of Interpretation   I assisted _Dr.Kolkon _________________ with interpretation of ___delivery___________________ for this patient.

## 2011-09-06 MED ORDER — IBUPROFEN 600 MG PO TABS: 600.0000 mg | ORAL_TABLET | Freq: Four times a day (QID) | ORAL | Status: AC

## 2011-09-06 MED ORDER — FERROUS SULFATE 325 (65 FE) MG PO TABS: 325.0000 mg | ORAL_TABLET | Freq: Two times a day (BID) | ORAL | Status: DC

## 2011-09-06 MED ORDER — PRENATAL 27-0.8 MG PO TABS: 1.0000 | ORAL_TABLET | Freq: Every day | ORAL | Status: DC

## 2011-09-06 NOTE — Discharge Summary (Signed)
Obstetric Discharge Summary Reason for Admission: onset of labor Prenatal Procedures: none Intrapartum Procedures: spontaneous vaginal delivery Postpartum Procedures: none Complications-Operative and Postpartum: none Hemoglobin  Date Value Range Status  09/05/2011 11.6* 12.0-15.0 (g/dL) Final     HCT  Date Value Range Status  09/05/2011 35.4* 36.0-46.0 (%) Final    Discharge Diagnoses: Term Pregnancy-delivered  Discharge Information: Date: 09/06/2011 Activity: pelvic rest Diet: routine Medications: PNV and Ibuprofen Condition: stable Instructions: refer to practice specific booklet Discharge to: home Follow-up Information    Follow up with Lloyd Huger, MD. Call in 6 weeks. (for postpartum visit)    Contact information:   [address] [place] [address] [phone]          Newborn Data: Live born female  Birth Weight: 7 lb 12.9 oz (3541 g) APGAR: 9, 9  Home with mother.  Tracey Valdez JEHIEL 09/06/2011, 3:08 PM

## 2011-09-06 NOTE — Progress Notes (Signed)
PSYCHOSOCIAL ASSESSMENT ~ MATERNAL/CHILD Name: Tracey Valdez         Age: 35 day    Referral Date : 09/05/2011   Reason/Source:  Hx of cocaine use in past pregnancy/CN  I. FAMILY/HOME ENVIRONMENT A. Child's Legal Guardian X Parent(s)  Name:  Tracey Valdez  DOB: February 03, 1976   Age:  26 Address : [address] [place]  [place] [address] Name: Tracey Valdez- he does not live in the home   B. Other Household Members/Support Persons Brother age 44          Sister age 14        Sister age 42                   Brother age 50 months C.  Other Support:       Tracey Valdez- maternal aunt [phone]  II. PSYCHOSOCIAL DATA A. Information Source X Patient Interview  X Family Interview             B. Financial and Community Resources X Medicaid (children)     County:  [place]           X Self Pay - [name]  X Food Stamps     X WIC  X Other: Public health dept for medical care-[name] and siblings  C. Cultural and Environment Information Cultural Issues Impacting Care:  Language Barrier/Spanish speaking  III. STRENGTHS X FOB provides financial support  X Adequate Resources  X Home prepared for Child (including basic supplies)                           X Other: Pediatrician - Family practice on [address]  IV. RISK FACTORS AND CURRENT PROBLEMS                   X Financial Resources Limited  X Limited social support  X Past maternal substance abuse                        V. SOCIAL WORK ASSESSMENT  Met with [name] at bedside to assess strengths and needs following referral for history of substance use in previous pregnancy.  FOB was also in the room per maternal request.  All other guests respectfully left the room so [name] and interpreter could talk with mom.  [name] reports that she lives alone with her 5 children.  This child's FOB states that he provides financial support to mom so basic needs like food, clothing, shelter can be maintained.  [name] plans to stay home with children for a while prior to  considering going back to work.  [name] reports she also receives public services that also help.  She plans to breast feed and use some formula.  She breast fed her previous child a little per mom's report.  Mom met with the Lactation Consultant and feels she has the information she needs to breast feed successfully if desired.  Discussed the importance of healthy environment for baby to support optimal growth, development and safety.  Discussed potential hazards, including smoke or maternal substance use.  [name] denies any smoke or substance use.  She reports having very limited support, though reports she can reach out in the event of a crisis or emergency.  Mom reports she has all baby's basic necessities for when baby arrives home.  Baby will stay next to her in his own crib during the newborn period.  She  was able to report safe infant care practices like leaving out blankets/stuffed animals from crib and not falling asleep with baby.  [name] appeared somewhat nervous and fidgety during session.  I asked if I was making her nervous and she reported that she was fine and everything was okay.  She did not express any concerns or needs at that time.  [name] was cooperative and conversational.  She had friends that had come to visit her today.       VI. SOCIAL WORK PLAN X No Further Intervention Required/No Barriers to Discharge X Information/Referral to [facility]:  Referral to [facility] due to single parent of 5 children, language barrier, past substance use, and limited social support    Staci Acosta, [name]  09/06/2011, 4:20 pm

## 2011-09-06 NOTE — Anesthesia Postprocedure Evaluation (Signed)
  Anesthesia Post-op Note  Patient: Tracey Valdez  Procedure(s) Performed: * No procedures listed *  Patient Location: Mother/Baby  Anesthesia Type: Epidural  Level of Consciousness: alert   Airway and Oxygen Therapy: Patient Spontanous Breathing  Post-op Pain: mild  Post-op Assessment: Patient's Cardiovascular Status Stable and Respiratory Function Stable  Post-op Vital Signs: stable  Complications: No apparent anesthesia complications

## 2011-09-06 NOTE — Progress Notes (Signed)
Post Partum Day 1  Subjective: no complaints, up ad lib, voiding and tolerating PO.  Pain well controlled.  Objective: Blood pressure 139/80, pulse 67, temperature 98.4 F (36.9 C), temperature source Oral, resp. rate 16, height 5\' 3"  (1.6 m), weight 68.493 kg (151 lb), last menstrual period 12/21/2010, SpO2 97.00%, unknown if currently breastfeeding.  Physical Exam:  General: alert, cooperative and no distress Lochia: appropriate Uterine Fundus: firm DVT Evaluation: No cords or calf tenderness. No significant calf/ankle edema.   Basename 09/05/11 0245  HGB 11.6*  HCT 35.4*    Assessment/Plan: Breastfeeding, Lactation consult and Contraception Mirena.  Would like to go home today if cleared by peds.  Will check with Peds.   LOS: 1 day   Ercie Eliasen JEHIEL 09/06/2011, 10:17 AM

## 2011-09-08 NOTE — ED Provider Notes (Signed)
Patient ruled in for labor.  Refer to H&P for term labor admission details.  Attestation of Attending Supervision of Advanced Practitioner: Evaluation and management procedures were performed by the PA/NP/CNM/OB Fellow under my supervision/collaboration. Chart reviewed, and agree with management and plan.  Jaynie Collins A M.D. 09/08/2011 11:19 AM

## 2011-09-08 NOTE — Progress Notes (Signed)
UR chart review completed.  

## 2011-09-08 NOTE — H&P (Signed)
Attestation of Attending Supervision of Advanced Practitioner: Evaluation and management procedures were performed by the PA/NP/CNM/OB Fellow under my supervision/collaboration. Chart reviewed, and agree with management and plan.  Jaynie Collins A M.D. 09/08/2011 11:20 AM

## 2011-10-04 DIAGNOSIS — S329XXA Fracture of unspecified parts of lumbosacral spine and pelvis, initial encounter for closed fracture: Secondary | ICD-10-CM

## 2011-10-04 HISTORY — DX: Fracture of unspecified parts of lumbosacral spine and pelvis, initial encounter for closed fracture: S32.9XXA

## 2011-10-06 ENCOUNTER — Inpatient Hospital Stay (HOSPITAL_COMMUNITY)
Admission: EM | Admit: 2011-10-06 | Discharge: 2011-10-09 | DRG: 184 | Disposition: A | Discharge status: Home / Self Care | Payer: No Typology Code available for payment source | Source: Ambulatory Visit | Attending: General Surgery | Admitting: General Surgery

## 2011-10-06 ENCOUNTER — Emergency Department (HOSPITAL_COMMUNITY): Payer: No Typology Code available for payment source

## 2011-10-06 ENCOUNTER — Encounter (HOSPITAL_COMMUNITY): Payer: Self-pay | Admitting: *Deleted

## 2011-10-06 DIAGNOSIS — S322XXA Fracture of coccyx, initial encounter for closed fracture: Secondary | ICD-10-CM | POA: Diagnosis present

## 2011-10-06 DIAGNOSIS — F17200 Nicotine dependence, unspecified, uncomplicated: Secondary | ICD-10-CM | POA: Diagnosis present

## 2011-10-06 DIAGNOSIS — S3210XA Unspecified fracture of sacrum, initial encounter for closed fracture: Secondary | ICD-10-CM | POA: Diagnosis present

## 2011-10-06 DIAGNOSIS — S329XXA Fracture of unspecified parts of lumbosacral spine and pelvis, initial encounter for closed fracture: Secondary | ICD-10-CM

## 2011-10-06 DIAGNOSIS — J9383 Other pneumothorax: Secondary | ICD-10-CM | POA: Diagnosis present

## 2011-10-06 DIAGNOSIS — S2249XA Multiple fractures of ribs, unspecified side, initial encounter for closed fracture: Secondary | ICD-10-CM | POA: Diagnosis present

## 2011-10-06 DIAGNOSIS — S2239XA Fracture of one rib, unspecified side, initial encounter for closed fracture: Secondary | ICD-10-CM | POA: Diagnosis present

## 2011-10-06 DIAGNOSIS — S32509A Unspecified fracture of unspecified pubis, initial encounter for closed fracture: Secondary | ICD-10-CM | POA: Diagnosis present

## 2011-10-06 DIAGNOSIS — S2220XA Unspecified fracture of sternum, initial encounter for closed fracture: Principal | ICD-10-CM | POA: Diagnosis present

## 2011-10-06 MED ORDER — SODIUM CHLORIDE 0.9 % IV SOLN
Freq: Once | INTRAVENOUS | Status: AC
  Administered 2011-10-07: 125 mL/h via INTRAVENOUS

## 2011-10-06 MED ORDER — OXYCODONE-ACETAMINOPHEN 5-325 MG PO TABS
1.0000 | ORAL_TABLET | Freq: Once | ORAL | Status: AC
  Administered 2011-10-06: 1 via ORAL
  Filled 2011-10-06: qty 1
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
## 2011-10-06 NOTE — ED Notes (Signed)
X-RAY notified PT ready for test

## 2011-10-06 NOTE — ED Notes (Signed)
MD at bedside. 

## 2011-10-06 NOTE — ED Notes (Signed)
Answered pts call light.  Pt requesting pain meds.  Comforted pt and explained I would inform RN of pt's pain.  Magda Paganini, RN notified of pts request
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
## 2011-10-07 ENCOUNTER — Encounter (HOSPITAL_COMMUNITY): Payer: Self-pay | Admitting: *Deleted

## 2011-10-07 ENCOUNTER — Inpatient Hospital Stay (HOSPITAL_COMMUNITY): Payer: No Typology Code available for payment source

## 2011-10-07 ENCOUNTER — Emergency Department (HOSPITAL_COMMUNITY): Payer: No Typology Code available for payment source

## 2011-10-07 DIAGNOSIS — S329XXA Fracture of unspecified parts of lumbosacral spine and pelvis, initial encounter for closed fracture: Secondary | ICD-10-CM | POA: Diagnosis present

## 2011-10-07 DIAGNOSIS — S2220XA Unspecified fracture of sternum, initial encounter for closed fracture: Secondary | ICD-10-CM

## 2011-10-07 DIAGNOSIS — S2249XA Multiple fractures of ribs, unspecified side, initial encounter for closed fracture: Secondary | ICD-10-CM

## 2011-10-07 DIAGNOSIS — S270XXA Traumatic pneumothorax, initial encounter: Secondary | ICD-10-CM

## 2011-10-07 DIAGNOSIS — S32509A Unspecified fracture of unspecified pubis, initial encounter for closed fracture: Secondary | ICD-10-CM

## 2011-10-07 DIAGNOSIS — S2239XA Fracture of one rib, unspecified side, initial encounter for closed fracture: Secondary | ICD-10-CM | POA: Diagnosis present

## 2011-10-07 LAB — LIPASE, BLOOD: Lipase: 47 U/L (ref 11–59)

## 2011-10-07 LAB — DIFFERENTIAL
Basophils Absolute: 0 10*3/uL (ref 0.0–0.1)
Basophils Relative: 0 % (ref 0–1)
Eosinophils Absolute: 0 10*3/uL (ref 0.0–0.7)
Neutrophils Relative %: 90 % — ABNORMAL HIGH (ref 43–77)

## 2011-10-07 LAB — BASIC METABOLIC PANEL
CO2: 23 mEq/L (ref 19–32)
Calcium: 8.5 mg/dL (ref 8.4–10.5)
Chloride: 105 mEq/L (ref 96–112)
Glucose, Bld: 145 mg/dL — ABNORMAL HIGH (ref 70–99)
Sodium: 139 mEq/L (ref 135–145)

## 2011-10-07 LAB — TYPE AND SCREEN: Antibody Screen: NEGATIVE

## 2011-10-07 LAB — COMPREHENSIVE METABOLIC PANEL
ALT: 395 U/L — ABNORMAL HIGH (ref 0–35)
Albumin: 4.3 g/dL (ref 3.5–5.2)
Alkaline Phosphatase: 138 U/L — ABNORMAL HIGH (ref 39–117)
Potassium: 3.3 mEq/L — ABNORMAL LOW (ref 3.5–5.1)
Sodium: 139 mEq/L (ref 135–145)
Total Protein: 7.2 g/dL (ref 6.0–8.3)

## 2011-10-07 LAB — CBC
Hemoglobin: 11.6 g/dL — ABNORMAL LOW (ref 12.0–15.0)
MCH: 26.4 pg (ref 26.0–34.0)
MCH: 27.1 pg (ref 26.0–34.0)
MCHC: 33.2 g/dL (ref 30.0–36.0)
Platelets: 195 10*3/uL (ref 150–400)
Platelets: 223 10*3/uL (ref 150–400)
RBC: 4.39 MIL/uL (ref 3.87–5.11)
WBC: 9.3 10*3/uL (ref 4.0–10.5)

## 2011-10-07 MED ORDER — FENTANYL CITRATE 0.05 MG/ML IJ SOLN: 50.0000 ug | Freq: Once | INTRAMUSCULAR | Status: DC

## 2011-10-07 MED ORDER — MORPHINE SULFATE 2 MG/ML IJ SOLN
2.0000 mg | INTRAMUSCULAR | Status: DC | PRN
  Administered 2011-10-07 – 2011-10-08 (×8): 2 mg via INTRAVENOUS
  Filled 2011-10-07 (×8): qty 1

## 2011-10-07 MED ORDER — ONDANSETRON HCL 4 MG PO TABS: 4.0000 mg | ORAL_TABLET | Freq: Four times a day (QID) | ORAL | Status: DC | PRN

## 2011-10-07 MED ORDER — IOHEXOL 300 MG/ML  SOLN
100.0000 mL | Freq: Once | INTRAMUSCULAR | Status: AC | PRN
  Administered 2011-10-07: 100 mL via INTRAVENOUS

## 2011-10-07 MED ORDER — HYDROMORPHONE HCL PF 1 MG/ML IJ SOLN
1.0000 mg | Freq: Once | INTRAMUSCULAR | Status: AC
  Administered 2011-10-07: 1 mg via INTRAVENOUS
  Filled 2011-10-07: qty 1

## 2011-10-07 MED ORDER — HYDROCODONE-ACETAMINOPHEN 5-325 MG PO TABS
0.5000 | ORAL_TABLET | ORAL | Status: DC | PRN
  Administered 2011-10-07: 0.5 via ORAL
  Filled 2011-10-07: qty 1

## 2011-10-07 MED ORDER — KCL IN DEXTROSE-NACL 20-5-0.9 MEQ/L-%-% IV SOLN
INTRAVENOUS | Status: DC
  Administered 2011-10-07 – 2011-10-08 (×2): via INTRAVENOUS
  Filled 2011-10-07 (×4): qty 1000

## 2011-10-07 MED ORDER — ONDANSETRON HCL 4 MG/2ML IJ SOLN: 4.0000 mg | Freq: Four times a day (QID) | INTRAMUSCULAR | Status: DC | PRN

## 2011-10-07 MED ORDER — PANTOPRAZOLE SODIUM 40 MG PO TBEC
40.0000 mg | DELAYED_RELEASE_TABLET | Freq: Every day | ORAL | Status: DC
  Administered 2011-10-07: 40 mg via ORAL
  Filled 2011-10-07 (×2): qty 1

## 2011-10-07 MED ORDER — FENTANYL CITRATE 0.05 MG/ML IJ SOLN
50.0000 ug | Freq: Once | INTRAMUSCULAR | Status: AC
  Administered 2011-10-07: 50 ug via INTRAVENOUS
  Filled 2011-10-07: qty 2

## 2011-10-07 MED ORDER — PANTOPRAZOLE SODIUM 40 MG IV SOLR
40.0000 mg | Freq: Every day | INTRAVENOUS | Status: DC
  Filled 2011-10-07 (×2): qty 40

## 2011-10-07 NOTE — ED Notes (Signed)
Pt hooked back up to the monitor.  No distress noted.  Pt reports having no change in her pain.
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
## 2011-10-07 NOTE — Progress Notes (Signed)
Physical Therapy Patient Details Name: Tracey Valdez MRN: [id number] DOB: [birth year] Today's Date: 10/07/2011  Please discontinue Bedrest order for PT eval.  Thanks.    Sunny Schlein, [place] [phone] 10/07/2011, 8:08 AM
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
## 2011-10-07 NOTE — Progress Notes (Signed)
Clinical Social Worker completed the psychosocial assessment which can be found in the shadow chart.  Patient plans to return home with her family at discharge.   Clinical Social Worker has completed SBIRT with patient at bedside.  Patient with no current alcohol concerns.  Patient breastfeeding newborn son at this time and knows the dangers of drinking alcohol while breastfeeding.  [address] [place], [place] [phone]
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
## 2011-10-07 NOTE — ED Provider Notes (Signed)
I saw and evaluated the patient, reviewed the resident's note and I agree with the findings and plan.   Dione Booze, MD 10/07/11 [id number]
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
## 2011-10-07 NOTE — ED Notes (Signed)
Ortho MD at bedside.  Will transport pt when they are done.

## 2011-10-07 NOTE — ED Notes (Signed)
Family at beside. Family given emotional support. 

## 2011-10-08 MED ORDER — ENOXAPARIN SODIUM 30 MG/0.3ML ~~LOC~~ SOLN
30.0000 mg | Freq: Two times a day (BID) | SUBCUTANEOUS | Status: DC
  Administered 2011-10-08 – 2011-10-09 (×2): 30 mg via SUBCUTANEOUS
  Filled 2011-10-08 (×6): qty 0.3

## 2011-10-08 MED ORDER — HYDROCODONE-ACETAMINOPHEN 5-325 MG PO TABS
1.0000 | ORAL_TABLET | ORAL | Status: DC | PRN
  Administered 2011-10-08: 2 via ORAL
  Filled 2011-10-08: qty 2

## 2011-10-08 MED ORDER — MORPHINE SULFATE 2 MG/ML IJ SOLN: 2.0000 mg | INTRAMUSCULAR | Status: DC | PRN

## 2011-10-08 MED ORDER — TRAMADOL HCL 50 MG PO TABS
100.0000 mg | ORAL_TABLET | Freq: Four times a day (QID) | ORAL | Status: DC
  Administered 2011-10-08 – 2011-10-09 (×4): 100 mg via ORAL
  Filled 2011-10-08 (×9): qty 2
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
## 2011-10-08 NOTE — Progress Notes (Signed)
Per chart, patient will return home with home health PT/OT and supervision.  CSW spoke with patient yesterday who states she will have support/assistance from friends and family.    Clinical Social Worker will sign off for now as social work intervention is no longer needed. Please consult [place] again if new need arises.   [address] [place], [place] [phone]

## 2011-10-08 NOTE — Progress Notes (Signed)
Tracey Valdez. Corliss Skains, MD, [facility]  10/08/2011 2:17 PM
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
## 2011-10-09 MED ORDER — HYDROCODONE-ACETAMINOPHEN 5-325 MG PO TABS: 1.0000 | ORAL_TABLET | ORAL | Status: AC | PRN

## 2011-10-09 MED ORDER — TRAMADOL HCL 50 MG PO TABS: 50.0000 mg | ORAL_TABLET | Freq: Four times a day (QID) | ORAL | Status: AC | PRN
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
## 2011-10-09 NOTE — Progress Notes (Signed)
Interpreter Wyvonnia Dusky For [place] and care [employer] 9.00pm

## 2011-10-09 NOTE — Progress Notes (Addendum)
Pt for d/c today. Will need HHPT and rolling walker, wheelchair and 3-in-1 bedside commode. Arrangements made with [facility]. She is discharging to her aunt's home: [address] [place], [address]. Aunt's name is Reed Pandy and her phone is [phone].  **Speaks no English. Spanish only.

## 2011-10-09 NOTE — Progress Notes (Addendum)
Discharge instructions/Med Rec Sheet reviewed w/ pt. Pt expressed understanding and copies given w/ prescriptions. Pt d/c'd in stable condition via w/c, accompanied by NT  

## 2011-10-09 NOTE — Progress Notes (Signed)
Seen and agree with SPT note Caelan Branden Tabor, PT [phone]  
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
## 2011-10-09 NOTE — Progress Notes (Signed)
This patient has been seen and I agree with the findings and treatment plan.  She is tolerating therapy and can go home  Marta Lamas. Gae Bon, MD, FACS [phone] (pager) [phone] (direct pager) Trauma Surgeon

## 2011-10-09 NOTE — Progress Notes (Signed)
Physical Therapy Treatment Patient Details Name: CHARLOTTA LAPAGLIA MRN: [id number] DOB: Oct 21, 1976 Today's Date: 10/09/2011  PT Assessment/Plan  PT - Assessment/Plan Comments on Treatment Session: Pt. progressed well today with ambulation.  Was able to walk 21 feet with RW and able to navigate stairs with verbal cueing and min assist.  Pt. also educated on proper sequencing for getting in/out of bed.   An interpreter was used throughout the session for pt. instruction.  Pt. given handout on stair navigation, and technique was demonstrated to pt.'s Aunt at the completion of the session.  Pt.'s Aunt also educated on bed mobility, use of wheel chair, and encouraging further ambulation at home.  Pt. stated that she had no questions concerning mobility, and that her Aunt would be able to provide the necessary level of assist upon return to home.   PT Plan: Discharge plan remains appropriate Follow Up Recommendations: 24 hour supervision/assistance PT Goals  Acute Rehab PT Goals PT Goal: Rolling Supine to Left Side - Progress: Progressing toward goal PT Goal: Supine/Side to Sit - Progress: Progressing toward goal PT Goal: Sit to Stand - Progress: Progressing toward goal PT Goal: Ambulate - Progress: Progressing toward goal Pt will Go Up / Down Stairs: 3-5 stairs;with min assist;with rolling walker PT Goal: Up/Down Stairs - Progress: Revised (modified due to lack of progress/goal met)  PT Treatment Precautions/Restrictions  Precautions Precautions: Fall Restrictions Weight Bearing Restrictions: No RLE Weight Bearing: Weight bearing as tolerated LLE Weight Bearing: Weight bearing as tolerated Mobility (including Balance) Bed Mobility Bed Mobility: Yes Rolling Left: 4: Min assist Rolling Left Details (indicate cue type and reason): Cues for hand placement and sequence.  Left Sidelying to Sit: 4: Min assist Left Sidelying to Sit Details (indicate cue type and reason): Cues to bring legs to  EOB, assist to raise upper body off of bed.  Sitting - Scoot to Edge of Bed: 5: Supervision Sitting - Scoot to [place] of Bed Details (indicate cue type and reason): Cues for sequence and initiation.  Sit to Supine - Left: 3: Mod assist Sit to Supine - Left Details (indicate cue type and reason): Assist to upper body to lower to side and assist to legs, pt. able to roll from sidelying to supine on own.  Transfers Transfers: Yes Sit to Stand: 4: Min assist;With upper extremity assist;From bed;From chair/3-in-1;With armrests Sit to Stand Details (indicate cue type and reason): Three trials, two from recliner and one from bed.  Cues to scoot forward and for hand placement.  Min assist to achieve upright posture and for safety.  Stand to Sit: 4: Min assist;To chair/3-in-1;To bed;With armrests Stand to Sit Details: Cues for hand placement, assist for control of descent.  Stand Pivot Transfers: 4: Min assist Stand Pivot Transfer Details (indicate cue type and reason): Pt. pivoted from bed to chair to the lef and chair to bed to the right.  Cueing for sequencing of steps. Ambulation/Gait Ambulation/Gait: Yes Ambulation/Gait Assistance: 5: Supervision Ambulation/Gait Assistance Details (indicate cue type and reason): Cues for sequencing of steps with RW.  Ambulation Distance (Feet): 21 Feet Assistive device: Rolling walker Gait Pattern: Step-to pattern;Shuffle;Decreased step length - right (Small step forward on right then shuffle forward on left) Stairs: Yes Stairs Assistance: 4: Min assist Stairs Assistance Details (indicate cue type and reason): Assistance for sequencing and stabilization of  RW and cues for step pattern.  Stair Management Technique: Step to pattern;Backwards;With walker Number of Stairs: 2  (two trials) Height of Stairs: 8  Exercise    End of Session PT - End of Session Equipment Utilized During Treatment: Gait belt Activity Tolerance: Patient tolerated treatment well  (Slightly limited by pain) Patient left: in bed;with call bell in reach Nurse Communication: Mobility status for transfers General Behavior During Session: [facility] for tasks performed Cognition: [facility] for tasks performed  Laney Pastor, SPT  10/09/2011, 3:44 PM

## 2011-10-09 NOTE — Discharge Summary (Signed)
This patient has been seen and I agree with the findings and treatment plan.  Zeyna Mkrtchyan O. Hollie Bartus, III, MD, FACS [phone] (pager) [phone] (direct pager) Trauma Surgeon  
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
## 2011-10-23 ENCOUNTER — Encounter (HOSPITAL_COMMUNITY): Payer: Self-pay | Admitting: Pharmacy Technician

## 2011-10-24 ENCOUNTER — Encounter (HOSPITAL_COMMUNITY): Payer: Self-pay | Admitting: Anesthesiology

## 2011-10-24 ENCOUNTER — Ambulatory Visit (HOSPITAL_COMMUNITY): Payer: No Typology Code available for payment source

## 2011-10-24 ENCOUNTER — Other Ambulatory Visit: Payer: Self-pay

## 2011-10-24 ENCOUNTER — Encounter (HOSPITAL_COMMUNITY)
Admission: RE | Disposition: A | Discharge status: Home / Self Care | Payer: Self-pay | Source: Ambulatory Visit | Attending: Orthopedic Surgery

## 2011-10-24 ENCOUNTER — Ambulatory Visit (HOSPITAL_COMMUNITY)
Admission: RE | Admit: 2011-10-24 | Discharge: 2011-10-24 | Disposition: A | Discharge status: Home / Self Care | Payer: No Typology Code available for payment source | Source: Ambulatory Visit | Attending: Orthopedic Surgery | Admitting: Orthopedic Surgery

## 2011-10-24 ENCOUNTER — Encounter (HOSPITAL_COMMUNITY): Payer: Self-pay | Admitting: *Deleted

## 2011-10-24 ENCOUNTER — Ambulatory Visit (HOSPITAL_COMMUNITY): Payer: No Typology Code available for payment source | Admitting: Anesthesiology

## 2011-10-24 DIAGNOSIS — S329XXA Fracture of unspecified parts of lumbosacral spine and pelvis, initial encounter for closed fracture: Secondary | ICD-10-CM

## 2011-10-24 DIAGNOSIS — S3289XA Fracture of other parts of pelvis, initial encounter for closed fracture: Secondary | ICD-10-CM | POA: Insufficient documentation

## 2011-10-24 HISTORY — PX: EXAMINATION UNDER ANESTHESIA: SHX1540

## 2011-10-24 LAB — CBC
HCT: 37.1 % (ref 36.0–46.0)
MCHC: 32.1 g/dL (ref 30.0–36.0)
MCV: 87.1 fL (ref 78.0–100.0)
RDW: 18.6 % — ABNORMAL HIGH (ref 11.5–15.5)

## 2011-10-24 LAB — URINE MICROSCOPIC-ADD ON

## 2011-10-24 LAB — COMPREHENSIVE METABOLIC PANEL
AST: 20 U/L (ref 0–37)
Albumin: 3.9 g/dL (ref 3.5–5.2)
Calcium: 9.8 mg/dL (ref 8.4–10.5)
Creatinine, Ser: 0.6 mg/dL (ref 0.50–1.10)

## 2011-10-24 LAB — URINALYSIS, ROUTINE W REFLEX MICROSCOPIC
Bilirubin Urine: NEGATIVE
Nitrite: NEGATIVE
Specific Gravity, Urine: 1.028 (ref 1.005–1.030)
Urobilinogen, UA: 2 mg/dL — ABNORMAL HIGH (ref 0.0–1.0)

## 2011-10-24 LAB — SURGICAL PCR SCREEN
MRSA, PCR: NEGATIVE
Staphylococcus aureus: NEGATIVE

## 2011-10-24 LAB — DIFFERENTIAL
Basophils Absolute: 0 10*3/uL (ref 0.0–0.1)
Basophils Relative: 0 % (ref 0–1)
Eosinophils Relative: 0 % (ref 0–5)
Monocytes Absolute: 0.4 10*3/uL (ref 0.1–1.0)

## 2011-10-24 LAB — APTT: aPTT: 34 seconds (ref 24–37)

## 2011-10-24 LAB — PROTIME-INR: INR: 0.93 (ref 0.00–1.49)

## 2011-10-24 LAB — TYPE AND SCREEN: Antibody Screen: NEGATIVE

## 2011-10-24 SURGERY — EXAM UNDER ANESTHESIA
Site: Pelvis | Laterality: Bilateral | Wound class: Clean

## 2011-10-24 MED ORDER — ROCURONIUM BROMIDE 100 MG/10ML IV SOLN
INTRAVENOUS | Status: DC | PRN
  Administered 2011-10-24: 40 mg via INTRAVENOUS

## 2011-10-24 MED ORDER — FENTANYL CITRATE 0.05 MG/ML IJ SOLN
INTRAMUSCULAR | Status: DC | PRN
  Administered 2011-10-24: 100 ug via INTRAVENOUS

## 2011-10-24 MED ORDER — CEFAZOLIN SODIUM 1-5 GM-% IV SOLN
1.0000 g | INTRAVENOUS | Status: AC
  Administered 2011-10-24: 1 g via INTRAVENOUS

## 2011-10-24 MED ORDER — ONDANSETRON HCL 4 MG/2ML IJ SOLN: 4.0000 mg | Freq: Once | INTRAMUSCULAR | Status: DC | PRN

## 2011-10-24 MED ORDER — MUPIROCIN 2 % EX OINT
TOPICAL_OINTMENT | Freq: Two times a day (BID) | CUTANEOUS | Status: DC
  Administered 2011-10-24: 1 via NASAL
  Filled 2011-10-24: qty 22

## 2011-10-24 MED ORDER — GLYCOPYRROLATE 0.2 MG/ML IJ SOLN
INTRAMUSCULAR | Status: DC | PRN
  Administered 2011-10-24: .6 mg via INTRAVENOUS

## 2011-10-24 MED ORDER — MUPIROCIN 2 % EX OINT
TOPICAL_OINTMENT | CUTANEOUS | Status: AC
  Administered 2011-10-24: 1 via NASAL
  Filled 2011-10-24: qty 22

## 2011-10-24 MED ORDER — ONDANSETRON HCL 4 MG/2ML IJ SOLN
INTRAMUSCULAR | Status: DC | PRN
  Administered 2011-10-24: 4 mg via INTRAVENOUS

## 2011-10-24 MED ORDER — MIDAZOLAM HCL 5 MG/5ML IJ SOLN
INTRAMUSCULAR | Status: DC | PRN
  Administered 2011-10-24: 2 mg via INTRAVENOUS

## 2011-10-24 MED ORDER — MEPERIDINE HCL 25 MG/ML IJ SOLN: 6.2500 mg | INTRAMUSCULAR | Status: DC | PRN

## 2011-10-24 MED ORDER — HYDROMORPHONE HCL PF 1 MG/ML IJ SOLN: 0.2500 mg | INTRAMUSCULAR | Status: DC | PRN

## 2011-10-24 MED ORDER — LACTATED RINGERS IV SOLN
INTRAVENOUS | Status: DC
  Administered 2011-10-24: 14:00:00 via INTRAVENOUS

## 2011-10-24 MED ORDER — CHLORHEXIDINE GLUCONATE 4 % EX LIQD: 60.0000 mL | Freq: Once | CUTANEOUS | Status: DC

## 2011-10-24 MED ORDER — LACTATED RINGERS IV SOLN
INTRAVENOUS | Status: DC | PRN
  Administered 2011-10-24: 14:00:00 via INTRAVENOUS

## 2011-10-24 MED ORDER — NEOSTIGMINE METHYLSULFATE 1 MG/ML IJ SOLN
INTRAMUSCULAR | Status: DC | PRN
  Administered 2011-10-24: 5 mg via INTRAVENOUS

## 2011-10-24 MED ORDER — CEFAZOLIN SODIUM 1-5 GM-% IV SOLN
INTRAVENOUS | Status: AC
  Filled 2011-10-24: qty 50

## 2011-10-24 MED ORDER — MORPHINE SULFATE 2 MG/ML IJ SOLN: 0.0500 mg/kg | INTRAMUSCULAR | Status: DC | PRN

## 2011-10-24 MED ORDER — DEXTROSE 5 % IV SOLN
INTRAVENOUS | Status: DC | PRN
  Administered 2011-10-24: 15:00:00 via INTRAVENOUS

## 2011-10-24 MED ORDER — PROPOFOL 10 MG/ML IV EMUL
INTRAVENOUS | Status: DC | PRN
  Administered 2011-10-24: 200 mg via INTRAVENOUS

## 2011-10-24 SURGICAL SUPPLY — 43 items
BLADE SURG ROTATE 9660 (MISCELLANEOUS) IMPLANT
BRUSH SCRUB DISP (MISCELLANEOUS) ×2 IMPLANT
CLOTH BEACON ORANGE TIMEOUT ST (SAFETY) ×1 IMPLANT
DRAIN CHANNEL 15F RND FF W/TCR (WOUND CARE) ×1 IMPLANT
DRAPE C-ARM 42X72 X-RAY (DRAPES) ×1 IMPLANT
DRAPE C-ARMOR (DRAPES) ×1 IMPLANT
DRAPE INCISE IOBAN 66X45 STRL (DRAPES) ×2 IMPLANT
DRAPE ORTHO SPLIT 77X108 STRL (DRAPES)
DRAPE SURG ORHT 6 SPLT 77X108 (DRAPES) ×2 IMPLANT
DRAPE U-SHAPE 47X51 STRL (DRAPES) ×2 IMPLANT
DRSG ADAPTIC 3X8 NADH LF (GAUZE/BANDAGES/DRESSINGS) ×1 IMPLANT
DRSG PAD ABDOMINAL 8X10 ST (GAUZE/BANDAGES/DRESSINGS) ×2 IMPLANT
ELECT REM PT RETURN 9FT ADLT (ELECTROSURGICAL)
ELECTRODE REM PT RTRN 9FT ADLT (ELECTROSURGICAL) ×1 IMPLANT
EVACUATOR SILICONE 100CC (DRAIN) ×1 IMPLANT
GLOVE BIO SURGEON STRL SZ7.5 (GLOVE) ×1 IMPLANT
GLOVE BIO SURGEON STRL SZ8 (GLOVE) ×1 IMPLANT
GLOVE BIOGEL PI IND STRL 7.5 (GLOVE) ×1 IMPLANT
GLOVE BIOGEL PI IND STRL 8 (GLOVE) ×1 IMPLANT
GLOVE BIOGEL PI INDICATOR 7.5 (GLOVE)
GLOVE BIOGEL PI INDICATOR 8 (GLOVE)
GOWN PREVENTION PLUS XLARGE (GOWN DISPOSABLE) ×1 IMPLANT
GOWN STRL NON-REIN LRG LVL3 (GOWN DISPOSABLE) ×2 IMPLANT
KIT BASIN OR (CUSTOM PROCEDURE TRAY) ×1 IMPLANT
KIT ROOM TURNOVER OR (KITS) ×1 IMPLANT
MANIFOLD NEPTUNE II (INSTRUMENTS) ×1 IMPLANT
NS IRRIG 1000ML POUR BTL (IV SOLUTION) ×2 IMPLANT
PACK TOTAL JOINT (CUSTOM PROCEDURE TRAY) ×1 IMPLANT
PAD ARMBOARD 7.5X6 YLW CONV (MISCELLANEOUS) ×2 IMPLANT
SPONGE GAUZE 4X4 12PLY (GAUZE/BANDAGES/DRESSINGS) ×2 IMPLANT
SPONGE LAP 18X18 X RAY DECT (DISPOSABLE) IMPLANT
STAPLER VISISTAT 35W (STAPLE) ×1 IMPLANT
SUCTION FRAZIER TIP 10 FR DISP (SUCTIONS) ×1 IMPLANT
SUT VIC AB 0 CT1 27 (SUTURE)
SUT VIC AB 0 CT1 27XBRD ANBCTR (SUTURE) ×4 IMPLANT
SUT VIC AB 1 CT1 18XCR BRD 8 (SUTURE) ×2 IMPLANT
SUT VIC AB 1 CT1 8-18 (SUTURE)
SUT VIC AB 2-0 CT1 27 (SUTURE)
SUT VIC AB 2-0 CT1 TAPERPNT 27 (SUTURE) ×4 IMPLANT
TOWEL OR 17X24 6PK STRL BLUE (TOWEL DISPOSABLE) ×1 IMPLANT
TOWEL OR 17X26 10 PK STRL BLUE (TOWEL DISPOSABLE) ×2 IMPLANT
TRAY FOLEY CATH 14FR (SET/KITS/TRAYS/PACK) IMPLANT
WATER STERILE IRR 1000ML POUR (IV SOLUTION) ×4 IMPLANT

## 2011-10-24 NOTE — Preoperative (Signed)
Beta Blockers   Reason not to administer Beta Blockers:Not Applicable 

## 2011-10-24 NOTE — Anesthesia Preprocedure Evaluation (Signed)
Anesthesia Evaluation  Patient identified by MRN, date of birth, ID band Patient awake    Reviewed: Allergy & Precautions, H&P , NPO status , Patient's Chart, lab work & pertinent test results  Airway Mallampati: I TM Distance: >3 FB Neck ROM: Full    Dental  (+) Teeth Intact and Dental Advisory Given   Pulmonary  clear to auscultation        Cardiovascular Regular Normal    Neuro/Psych    GI/Hepatic   Endo/Other    Renal/GU      Musculoskeletal   Abdominal   Peds  Hematology   Anesthesia Other Findings   Reproductive/Obstetrics                           Anesthesia Physical Anesthesia Plan  ASA: I  Anesthesia Plan: General   Post-op Pain Management:    Induction: Intravenous  Airway Management Planned: Oral ETT  Additional Equipment:   Intra-op Plan:   Post-operative Plan: Extubation in OR  Informed Consent: I have reviewed the patients History and Physical, chart, labs and discussed the procedure including the risks, benefits and alternatives for the proposed anesthesia with the patient or authorized representative who has indicated his/her understanding and acceptance.   Dental advisory given  Plan Discussed with: CRNA, Anesthesiologist and Surgeon  Anesthesia Plan Comments:         Anesthesia Quick Evaluation

## 2011-10-24 NOTE — Transfer of Care (Signed)
Immediate Anesthesia Transfer of Care Note  Patient: Tracey Valdez  Procedure(s) Performed:  Francia Greaves UNDER ANESTHESIA - examination of pelvic fracture under anesthesia  Patient Location: PACU  Anesthesia Type: General  Level of Consciousness: awake, sedated, patient cooperative and responds to stimulation  Airway & Oxygen Therapy: Patient Spontanous Breathing and Patient connected to nasal cannula oxygen  Post-op Assessment: Report given to PACU RN, Post -op Vital signs reviewed and stable, Patient moving all extremities and Patient moving all extremities X 4  Post vital signs: Reviewed and stable  Complications: No apparent anesthesia complications

## 2011-10-24 NOTE — Anesthesia Postprocedure Evaluation (Signed)
  Anesthesia Post-op Note  Patient: Tracey Valdez  Procedure(s) Performed:  Francia Greaves UNDER ANESTHESIA - examination of pelvic fracture under anesthesia  Patient Location: PACU  Anesthesia Type: General  Level of Consciousness: awake and alert   Airway and Oxygen Therapy: Patient Spontanous Breathing and Patient connected to nasal cannula oxygen  Post-op Pain: mild  Post-op Assessment: Post-op Vital signs reviewed, Patient's Cardiovascular Status Stable, Respiratory Function Stable, Patent Airway, No signs of Nausea or vomiting and Pain level controlled  Post-op Vital Signs: Reviewed and stable  Complications: No apparent anesthesia complications

## 2011-10-24 NOTE — H&P (Signed)
Orthopaedic Trauma Service  Discussed pt yesterday evening with Dr. Dion Saucier.  Upon follow up at the office further displacement of the R hemipelvis was noted. Pt was noted to be ambulating without any assistive devices at the office as well.  After reviewing clinical data it was decided that attempt at open reduction and internal fixation be made to achieve better alignment.  Pt was contacted immediately after discussion and case was posted.   Pt to be evaluated in holding area  Previous notes from earlier in the month are noted as well  P:  ORIF R hemi pelvis  Mearl Latin, PA-C 10/24/2011 0730

## 2011-10-24 NOTE — Progress Notes (Signed)
Patient's aunt Reed Pandy) would like to know how procedure went. Placed note on front of chart with phone number [phone]).

## 2011-10-24 NOTE — Anesthesia Procedure Notes (Signed)
Procedure Name: Intubation Date/Time: 10/24/2011 3:10 PM Performed by: Ivin Booty, Alberto Schoch A Pre-anesthesia Checklist: Patient identified, Timeout performed, Emergency Drugs available, Suction available and Patient being monitored Patient Re-evaluated:Patient Re-evaluated prior to inductionOxygen Delivery Method: Circle System Utilized Preoxygenation: Pre-oxygenation with 100% oxygen Intubation Type: IV induction and Circoid Pressure applied Ventilation: Mask ventilation without difficulty Laryngoscope Size: Miller and 2 Grade View: Grade I Tube type: Oral Tube size: 7.5 mm Number of attempts: 1 Airway Equipment and Method: stylet Placement Confirmation: ETT inserted through vocal cords under direct vision,  positive ETCO2 and breath sounds checked- equal and bilateral Secured at: 21 cm Tube secured with: Tape Dental Injury: Teeth and Oropharynx as per pre-operative assessment

## 2011-10-24 NOTE — Brief Op Note (Signed)
10/24/2011  3:33 PM  PATIENT:  KATHA KUEHNE  35 y.o. female  PRE-OPERATIVE DIAGNOSIS:  rt pelvic fracture with loss of red  POST-OPERATIVE DIAGNOSIS:   rt pelvic fracture with loss of red  PROCEDURE:  Stress evaluation under anesthesia  SURGEON:  Surgeon(s): Budd Palmer  PHYSICIAN ASSISTANT:  [name], [facility]  ANESTHESIA:   general  EBL:   none  BLOOD ADMINISTERED:none  DRAINS: none   LOCAL MEDICATIONS USED:  NONE  SPECIMEN:  No Specimen  DISPOSITION OF SPECIMEN:  N/A  COUNTS:  YES  TOURNIQUET:  * No tourniquets in log *  DICTATION: .Other Dictation: Dictation Number [id number]  PLAN OF CARE: Admit to inpatient   PATIENT DISPOSITION:  PACU - hemodynamically stable.   Delay start of Pharmacological VTE agent (>24hrs) due to surgical blood loss or risk of bleeding: NO
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
## 2011-10-29 ENCOUNTER — Encounter: Payer: Self-pay | Admitting: Family Medicine

## 2011-10-29 ENCOUNTER — Ambulatory Visit (INDEPENDENT_AMBULATORY_CARE_PROVIDER_SITE_OTHER): Payer: Self-pay | Admitting: Family Medicine

## 2011-10-29 DIAGNOSIS — R5381 Other malaise: Secondary | ICD-10-CM

## 2011-10-29 DIAGNOSIS — Z309 Encounter for contraceptive management, unspecified: Secondary | ICD-10-CM | POA: Insufficient documentation

## 2011-10-29 DIAGNOSIS — F4321 Adjustment disorder with depressed mood: Secondary | ICD-10-CM

## 2011-10-29 DIAGNOSIS — R5383 Other fatigue: Secondary | ICD-10-CM

## 2011-10-29 LAB — TSH: TSH: 0.655 u[IU]/mL (ref 0.350–4.500)
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
## 2011-10-29 NOTE — Patient Instructions (Signed)
Make appt with PCP (if nexplanon certified) or may schedule with Dr. Earnest Bailey  Make 2 week follow-up to discuss your mood and energy.
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
## 2011-10-29 NOTE — Assessment & Plan Note (Signed)
Discussed options, patient would like to have implanon, will schedule with PCP Curlene Labrum will hold Scholarship Implanon once appt scheduled.  Patient aware of $65 office charge.  If PCP not available or certified, please schedule with [facility] for insertion.
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
## 2011-10-30 ENCOUNTER — Encounter (HOSPITAL_COMMUNITY): Payer: Self-pay | Admitting: Orthopedic Surgery

## 2011-11-17 ENCOUNTER — Ambulatory Visit: Payer: No Typology Code available for payment source | Admitting: Family Medicine

## 2012-01-12 ENCOUNTER — Other Ambulatory Visit (HOSPITAL_COMMUNITY)
Admission: RE | Admit: 2012-01-12 | Discharge: 2012-01-12 | Disposition: A | Discharge status: Home / Self Care | Payer: Self-pay | Source: Ambulatory Visit | Attending: Family Medicine | Admitting: Family Medicine

## 2012-01-12 ENCOUNTER — Encounter: Payer: Self-pay | Admitting: Family Medicine

## 2012-01-12 ENCOUNTER — Ambulatory Visit (INDEPENDENT_AMBULATORY_CARE_PROVIDER_SITE_OTHER): Payer: No Typology Code available for payment source | Admitting: Family Medicine

## 2012-01-12 VITALS — BP 102/66 | HR 85 | Temp 98.2°F | Wt 125.2 lb

## 2012-01-12 DIAGNOSIS — Z30430 Encounter for insertion of intrauterine contraceptive device: Secondary | ICD-10-CM

## 2012-01-12 DIAGNOSIS — Z124 Encounter for screening for malignant neoplasm of cervix: Secondary | ICD-10-CM

## 2012-01-12 DIAGNOSIS — Z01419 Encounter for gynecological examination (general) (routine) without abnormal findings: Secondary | ICD-10-CM | POA: Insufficient documentation

## 2012-01-12 DIAGNOSIS — Z309 Encounter for contraceptive management, unspecified: Secondary | ICD-10-CM

## 2012-01-12 MED ORDER — LEVONORGESTREL 20 MCG/24HR IU IUD
INTRAUTERINE_SYSTEM | Freq: Once | INTRAUTERINE | Status: AC
  Administered 2012-01-12: 1 via INTRAUTERINE

## 2012-01-12 NOTE — Progress Notes (Signed)
  Subjective:    Patient ID: Tracey Valdez, female    DOB: 1975/11/08, 36 y.o.   MRN: [id number]  HPI  1. For IUD placement. Counseled regarding risks/benefits and side effects including pain, bleeding, migration, improper placement.  Review of Systems Feels well today. Has not resumed menstrual cycles post-partum. No abnormal bleeding.    Objective:   Physical Exam  Vitals reviewed. Constitutional: She is oriented to person, place, and time. She appears well-developed. No distress.  HENT:  Head: Normocephalic and atraumatic.  Abdominal: Soft.  Genitourinary: Vagina normal. No vaginal discharge found.       Cervical mucosa friable and glandular appearing tissue at 8-12 o clock position.   Neurological: She is alert and oriented to person, place, and time.  Skin: No rash noted. She is not diaphoretic.  Psychiatric: She has a normal mood and affect.      Assessment & Plan:    IUD Insertion Procedure Note  Pre-operative Diagnosis: Desires contraception  Post-operative Diagnosis: same  Indications: contraception  Procedure Details  Urine pregnancy test was done and result was neg.  The risks (including infection, bleeding, pain, and uterine perforation) and benefits of the procedure were explained to the patient and Written informed consent was obtained.    Cervix cleansed with Betadine. Uterus sounded to 7 cm. IUD inserted without difficulty. String visible and trimmed. Patient tolerated procedure well.  IUD Information: Mirena, Lot # .  Condition: Stable  Complications: None  Plan:  The patient was advised to call for any fever or for prolonged or severe pain or bleeding. She was advised to use OTC acetaminophen as needed for mild to moderate pain. Have obtained pap smear today given irregularity in cervix at 8-12 position. Will have patient return for colposcopy also. I have explained need for follow up of lesion via interpretor [place], patient voices  understanding.   Attending Physician Documentation: Dr. Ilsa Iha present for initiation of procedure.

## 2012-01-12 NOTE — Patient Instructions (Signed)
We placed a mirena IUD today. You may have some cramping and bleeding. That is normal. If you have pain, bleeding, fever we need to see you again. Get orange card.  You need to have colposcopy with Dr. Jennette Kettle. Make colpo clinic appointment today.

## 2012-01-13 DIAGNOSIS — Z30430 Encounter for insertion of intrauterine contraceptive device: Secondary | ICD-10-CM | POA: Insufficient documentation

## 2012-02-06 ENCOUNTER — Encounter: Payer: Self-pay | Admitting: Family Medicine
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
## 2014-09-04 ENCOUNTER — Encounter: Payer: Self-pay | Admitting: Family Medicine

## 2014-12-14 ENCOUNTER — Emergency Department (HOSPITAL_COMMUNITY)
Admission: EM | Admit: 2014-12-14 | Discharge: 2014-12-14 | Disposition: A | Discharge status: Home / Self Care | Payer: Self-pay | Attending: Emergency Medicine | Admitting: Emergency Medicine

## 2014-12-14 ENCOUNTER — Encounter (HOSPITAL_COMMUNITY): Payer: Self-pay

## 2014-12-14 ENCOUNTER — Emergency Department (HOSPITAL_COMMUNITY): Payer: Medicaid Other

## 2014-12-14 DIAGNOSIS — R112 Nausea with vomiting, unspecified: Secondary | ICD-10-CM | POA: Insufficient documentation

## 2014-12-14 DIAGNOSIS — R000 Tachycardia, unspecified: Secondary | ICD-10-CM | POA: Insufficient documentation

## 2014-12-14 DIAGNOSIS — Z3202 Encounter for pregnancy test, result negative: Secondary | ICD-10-CM | POA: Insufficient documentation

## 2014-12-14 DIAGNOSIS — Z8781 Personal history of (healed) traumatic fracture: Secondary | ICD-10-CM | POA: Insufficient documentation

## 2014-12-14 DIAGNOSIS — K529 Noninfective gastroenteritis and colitis, unspecified: Secondary | ICD-10-CM | POA: Insufficient documentation

## 2014-12-14 DIAGNOSIS — Z720 Tobacco use: Secondary | ICD-10-CM | POA: Insufficient documentation

## 2014-12-14 LAB — CBC WITH DIFFERENTIAL/PLATELET
BASOS PCT: 0 % (ref 0–1)
Basophils Absolute: 0 10*3/uL (ref 0.0–0.1)
Eosinophils Absolute: 0 10*3/uL (ref 0.0–0.7)
Eosinophils Relative: 0 % (ref 0–5)
HEMATOCRIT: 40.2 % (ref 36.0–46.0)
HEMOGLOBIN: 13.5 g/dL (ref 12.0–15.0)
LYMPHS PCT: 4 % — AB (ref 12–46)
Lymphs Abs: 0.6 10*3/uL — ABNORMAL LOW (ref 0.7–4.0)
MCH: 30.6 pg (ref 26.0–34.0)
MCHC: 33.6 g/dL (ref 30.0–36.0)
MCV: 91.2 fL (ref 78.0–100.0)
MONOS PCT: 4 % (ref 3–12)
Monocytes Absolute: 0.7 10*3/uL (ref 0.1–1.0)
NEUTROS ABS: 14.5 10*3/uL — AB (ref 1.7–7.7)
NEUTROS PCT: 92 % — AB (ref 43–77)
Platelets: 204 10*3/uL (ref 150–400)
RBC: 4.41 MIL/uL (ref 3.87–5.11)
RDW: 12.5 % (ref 11.5–15.5)
WBC: 15.8 10*3/uL — ABNORMAL HIGH (ref 4.0–10.5)

## 2014-12-14 LAB — COMPREHENSIVE METABOLIC PANEL
ALBUMIN: 4.1 g/dL (ref 3.5–5.2)
ALT: 18 U/L (ref 0–35)
ANION GAP: 10 (ref 5–15)
AST: 22 U/L (ref 0–37)
Alkaline Phosphatase: 57 U/L (ref 39–117)
BUN: 15 mg/dL (ref 6–23)
CO2: 23 mmol/L (ref 19–32)
CREATININE: 0.61 mg/dL (ref 0.50–1.10)
Calcium: 8.5 mg/dL (ref 8.4–10.5)
Chloride: 105 mmol/L (ref 96–112)
GFR calc Af Amer: >90 mL/min (ref >90)
GFR calc non Af Amer: >90 mL/min (ref >90)
Glucose, Bld: 106 mg/dL — ABNORMAL HIGH (ref 70–99)
Potassium: 3.7 mmol/L (ref 3.5–5.1)
Sodium: 138 mmol/L (ref 135–145)
TOTAL PROTEIN: 6.6 g/dL (ref 6.0–8.3)
Total Bilirubin: 0.8 mg/dL (ref 0.3–1.2)

## 2014-12-14 LAB — URINE MICROSCOPIC-ADD ON

## 2014-12-14 LAB — URINALYSIS, ROUTINE W REFLEX MICROSCOPIC
Bilirubin Urine: NEGATIVE
GLUCOSE, UA: NEGATIVE mg/dL
Hgb urine dipstick: NEGATIVE
Ketones, ur: NEGATIVE mg/dL
NITRITE: NEGATIVE
PROTEIN: NEGATIVE mg/dL
Specific Gravity, Urine: 1.02 (ref 1.005–1.030)
UROBILINOGEN UA: 0.2 mg/dL (ref 0.0–1.0)
pH: 5 (ref 5.0–8.0)

## 2014-12-14 LAB — LIPASE, BLOOD: LIPASE: 21 U/L (ref 11–59)

## 2014-12-14 LAB — PREGNANCY, URINE: PREG TEST UR: NEGATIVE

## 2014-12-14 MED ORDER — MORPHINE SULFATE 4 MG/ML IJ SOLN
4.0000 mg | Freq: Once | INTRAMUSCULAR | Status: AC
  Administered 2014-12-14: 4 mg via INTRAVENOUS
  Filled 2014-12-14: qty 1

## 2014-12-14 MED ORDER — SODIUM CHLORIDE 0.9 % IV BOLUS (SEPSIS)
1000.0000 mL | Freq: Once | INTRAVENOUS | Status: AC
  Administered 2014-12-14: 1000 mL via INTRAVENOUS

## 2014-12-14 MED ORDER — IOHEXOL 300 MG/ML  SOLN
25.0000 mL | Freq: Once | INTRAMUSCULAR | Status: AC | PRN
  Administered 2014-12-14: 25 mL via ORAL

## 2014-12-14 MED ORDER — ONDANSETRON HCL 4 MG/2ML IJ SOLN
4.0000 mg | Freq: Once | INTRAMUSCULAR | Status: AC
  Administered 2014-12-14: 4 mg via INTRAVENOUS
  Filled 2014-12-14: qty 2

## 2014-12-14 MED ORDER — HYDROCODONE-ACETAMINOPHEN 5-325 MG PO TABS: 2.0000 | ORAL_TABLET | ORAL | Status: DC | PRN

## 2014-12-14 MED ORDER — LORAZEPAM 1 MG PO TABS
1.0000 mg | ORAL_TABLET | Freq: Once | ORAL | Status: AC
  Administered 2014-12-14: 1 mg via ORAL
  Filled 2014-12-14: qty 1

## 2014-12-14 MED ORDER — LOPERAMIDE HCL 2 MG PO CAPS: 2.0000 mg | ORAL_CAPSULE | Freq: Four times a day (QID) | ORAL | Status: DC | PRN

## 2014-12-14 MED ORDER — IOHEXOL 300 MG/ML  SOLN
100.0000 mL | Freq: Once | INTRAMUSCULAR | Status: AC | PRN
  Administered 2014-12-14: 100 mL via INTRAVENOUS

## 2014-12-14 MED ORDER — PROMETHAZINE HCL 25 MG PO TABS: 25.0000 mg | ORAL_TABLET | Freq: Four times a day (QID) | ORAL | Status: DC | PRN

## 2014-12-14 MED ORDER — KETOROLAC TROMETHAMINE 60 MG/2ML IM SOLN
60.0000 mg | Freq: Once | INTRAMUSCULAR | Status: AC
  Administered 2014-12-14: 60 mg via INTRAMUSCULAR
  Filled 2014-12-14: qty 2

## 2014-12-14 NOTE — ED Notes (Signed)
CT informed patient finished with contrast
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
## 2014-12-14 NOTE — ED Notes (Signed)
Pt reporting nausea during discharge after RN took IV out.  PA made aware.  Order placed for Ativan PO.

## 2014-12-14 NOTE — ED Provider Notes (Signed)
CSN: [id number]     Arrival date & time 12/14/14  7829 History   First MD Initiated Contact with Patient 12/14/14 0848     Chief Complaint  Patient presents with  . Abdominal Pain     (Consider location/radiation/quality/duration/timing/severity/associated sxs/prior Treatment) HPI Comments: Patient is a 39 year old female with no significant past medical history who presents with abdominal pain that started yesterday. The pain is located in her LLQ and does not radiate. The pain is described as cramping and severe. The pain started gradually and progressively worsened since the onset. No alleviating/aggravating factors. The patient has tried nothing for symptoms without relief. Associated symptoms include nausea and 3 episodes of vomiting. Patient denies fever, headache, diarrhea, chest pain, SOB, dysuria, constipation, abnormal vaginal bleeding/discharge. No history of abdominal surgery. Patient does not get her menstrual cycle due to IUD.      Past Medical History  Diagnosis Date  . No pertinent past medical history   . Pelvic fracture 2013   Past Surgical History  Procedure Laterality Date  . Examination under anesthesia  10/24/2011    Procedure: EXAM UNDER ANESTHESIA;  Surgeon: Budd Palmer;  Location: [facility];  Service: Orthopedics;  Laterality: Bilateral;  examination of pelvic fracture under anesthesia   Family History  Problem Relation Age of Onset  . Diabetes Mother   . Diabetes Sister   . Anesthesia problems Neg Hx   . Hypotension Neg Hx   . Malignant hyperthermia Neg Hx   . Pseudochol deficiency Neg Hx    History  Substance Use Topics  . Smoking status: Current Some Day Smoker    Types: Cigarettes  . Smokeless tobacco: Never Used     Comment: patient has not smoked in 3 months.   . Alcohol Use: No   OB History    Gravida Para Term Preterm AB TAB SAB Ectopic Multiple Living   Review of Systems  Constitutional: Negative for fever, chills and  fatigue.  HENT: Negative for trouble swallowing.   Eyes: Negative for visual disturbance.  Respiratory: Negative for shortness of breath.   Cardiovascular: Negative for chest pain and palpitations.  Gastrointestinal: Positive for nausea, vomiting and abdominal pain. Negative for diarrhea.  Genitourinary: Negative for dysuria and difficulty urinating.  Musculoskeletal: Negative for arthralgias and neck pain.  Skin: Negative for color change.  Neurological: Negative for dizziness and weakness.  Psychiatric/Behavioral: Negative for dysphoric mood.      Allergies  Review of patient's allergies indicates no known allergies.  Home Medications   Prior to Admission medications   Not on File   BP 113/99 mmHg  Pulse 105  Temp(Src) 98 F (36.7 C) (Oral)  Resp 26  SpO2 100% Physical Exam  Constitutional: She is oriented to person, place, and time. She appears well-developed and well-nourished. No distress.  HENT:  Head: Normocephalic and atraumatic.  Eyes: Conjunctivae and EOM are normal.  Neck: Normal range of motion.  Cardiovascular: Regular rhythm.  Exam reveals no gallop and no friction rub.   No murmur heard. tachycardia  Pulmonary/Chest: Effort normal and breath sounds normal. She has no wheezes. She has no rales. She exhibits no tenderness.  Abdominal: Soft. She exhibits no distension. There is tenderness. There is no rebound.  Left lower abdominal tenderness to palpation. No other focal tenderness or peritoneal.   Musculoskeletal: Normal range of motion.  Neurological: She is alert and oriented to person, place,  and time. Coordination normal.  Speech is goal-oriented. Moves limbs without ataxia.   Skin: Skin is warm and dry.  Psychiatric: She has a normal mood and affect. Her behavior is normal.  Nursing note and vitals reviewed.   ED Course  Procedures (including critical care time) Labs Review Labs Reviewed  CBC WITH DIFFERENTIAL/PLATELET - Abnormal; Notable for the  following:    WBC 15.8 (*)    Neutrophils Relative % 92 (*)    Neutro Abs 14.5 (*)    Lymphocytes Relative 4 (*)    Lymphs Abs 0.6 (*)    All other components within normal limits  URINALYSIS, ROUTINE W REFLEX MICROSCOPIC - Abnormal; Notable for the following:    APPearance HAZY (*)    Leukocytes, UA SMALL (*)    All other components within normal limits  COMPREHENSIVE METABOLIC PANEL - Abnormal; Notable for the following:    Glucose, Bld 106 (*)    All other components within normal limits  PREGNANCY, URINE  LIPASE, BLOOD  URINE MICROSCOPIC-ADD ON    Imaging Review Ct Abdomen Pelvis W Contrast  12/14/2014   CLINICAL DATA:  Left lower quadrant abdominal pain with cramping, nausea and vomiting.  EXAM: CT ABDOMEN AND PELVIS WITH CONTRAST  TECHNIQUE: Multidetector CT imaging of the abdomen and pelvis was performed using the standard protocol following bolus administration of intravenous contrast.  CONTRAST:  100mL OMNIPAQUE IOHEXOL 300 MG/ML  SOLN  COMPARISON:  10/07/2011 CT scan  FINDINGS: Lower chest: Dependent subsegmental atelectasis in both lower lobes.  Hepatobiliary: Unremarkable  Pancreas: Unremarkable  Spleen: Unremarkable  Adrenals/Urinary Tract: The unremarkable  Stomach/Bowel: There is wall thickening throughout most of the colon, some of which might be ascribed to none distension, but colitis is not excluded. Terminal ileum and appendix unremarkable.  Vascular/Lymphatic: Numerous small periaortic lymph nodes are not pathologically enlarged by size criteria but are somewhat prominent in number. No pathologic pelvic adenopathy.  Reproductive: IUD appears satisfactorily positioned. Ovaries unremarkable.  Other: Trace amount of fluid adjacent to the rectosigmoid junction.  Musculoskeletal: Pelvic deformities from prior fractures, particularly in the right sacrum and right pubic rami and, causing some distortion of adjacent structures. No lumbar spine impingement observed.  IMPRESSION: 1.  Relatively diffuse wall thickening in the colon suspicious for colitis. Numerous but small retroperitoneal lymph nodes may be reactive. There is a trace amount of fluid adjacent to the rectosigmoid junction. 2. Considerable pelvic deformities related to prior fractures.   Electronically Signed   By: Gaylyn RongWalter  Liebkemann M.D.   On: 12/14/2014 14:30     EKG Interpretation None      MDM   Final diagnoses:  Colitis    9:35 AM Patient is mildly tachycardic with remaining vitals stable. Labs and urinalysis pending. Patient will have morphine and zofran.   3:27 PM  Patient's CT shows colitis which is likely causing the patient's symptoms. Patient will have pain and nausea medication. Patient will have imodium for diarrhea. Patient instructed to return with worsening or concerning symptoms.   Emilia BeckKaitlyn Elesha Thedford, PA-C 12/14/14 1532  Elwin MochaBlair Walden, MD 12/15/14 [phone]

## 2014-12-14 NOTE — Discharge Instructions (Signed)
Take vicodin as needed for pain. Take phenergan for nausea. Take imodium for diarrhea. Refer to attached documents for more information. Return to the ED with worsening or concerning symptoms.

## 2014-12-14 NOTE — ED Notes (Addendum)
Went to room at reassess patient and room was empty.

## 2014-12-14 NOTE — ED Notes (Signed)
Pt. States "I'm going to call my friend to come pick me up".

## 2015-11-29 ENCOUNTER — Encounter: Payer: Self-pay | Admitting: Internal Medicine

## 2015-11-29 ENCOUNTER — Ambulatory Visit (INDEPENDENT_AMBULATORY_CARE_PROVIDER_SITE_OTHER): Payer: Self-pay | Admitting: Internal Medicine

## 2015-11-29 VITALS — BP 124/82 | HR 88 | Resp 18 | Ht 62.75 in | Wt 143.0 lb

## 2015-11-29 DIAGNOSIS — Z7189 Other specified counseling: Secondary | ICD-10-CM

## 2015-11-29 DIAGNOSIS — Z7689 Persons encountering health services in other specified circumstances: Secondary | ICD-10-CM
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
## 2015-11-29 NOTE — Patient Instructions (Addendum)
Can google "advance directives, [place]"  And bring up form from Secretary of [place]. Print and fill out Or can go to "5 wishes"  Which is also in Spanish and fill out--this costs $5--perhaps easier to use. Designate a Cytogeneticist to speak for you if you are unable to speak for yourself when ill or injured  [name] un vaso de agua antes de cada comida Tome un minimo de 6 a 8 vasos de agua diarios Coma tres veces al dia Coma una proteina y [name] grasa saludable con comida.  (huevos, pescado, pollo, pavo, y limite carnes rojas Coma 5 porciones diarias de legumbres.  Mezcle los colores Coma 2 porciones diarias de frutas con cascara cuando sea comestible Use platos pequeos Suelte su tenedor o cuchara despues de cada mordida hata que se mastique y se trague Come en la mesa con amigos o familiares por lo menos una vez al dia Apague la televisin y aparatos electrnicos durante la comida  Su objetivo debe ser perder Tracey Valdez por semana  Get your orange card so we can send you to the dentist.  Call [phone] for an appointment at [address] for Pap, IUD.  If they do not do a mammogram, call and we can get that set up for you as well.

## 2022-12-17 ENCOUNTER — Other Ambulatory Visit: Payer: Self-pay | Admitting: Obstetrics & Gynecology

## 2022-12-17 DIAGNOSIS — Z1231 Encounter for screening mammogram for malignant neoplasm of breast: Secondary | ICD-10-CM

## 2023-01-22 ENCOUNTER — Ambulatory Visit
Admission: RE | Admit: 2023-01-22 | Discharge: 2023-01-22 | Disposition: A | Discharge status: Home / Self Care | Payer: No Typology Code available for payment source | Source: Ambulatory Visit | Attending: Obstetrics & Gynecology | Admitting: Obstetrics & Gynecology

## 2023-01-22 DIAGNOSIS — Z1231 Encounter for screening mammogram for malignant neoplasm of breast: Secondary | ICD-10-CM

## 2023-06-11 ENCOUNTER — Encounter (HOSPITAL_COMMUNITY): Payer: Self-pay

## 2023-06-11 ENCOUNTER — Ambulatory Visit (HOSPITAL_COMMUNITY)
Admission: EM | Admit: 2023-06-11 | Discharge: 2023-06-11 | Disposition: A | Discharge status: Home / Self Care | Payer: Self-pay | Attending: Family Medicine | Admitting: Family Medicine

## 2023-06-11 DIAGNOSIS — M545 Low back pain, unspecified: Secondary | ICD-10-CM | POA: Insufficient documentation

## 2023-06-11 DIAGNOSIS — R1032 Left lower quadrant pain: Secondary | ICD-10-CM | POA: Insufficient documentation

## 2023-06-11 DIAGNOSIS — R1031 Right lower quadrant pain: Secondary | ICD-10-CM | POA: Insufficient documentation

## 2023-06-11 LAB — POCT URINALYSIS DIP (MANUAL ENTRY)
Bilirubin, UA: NEGATIVE
Glucose, UA: NEGATIVE mg/dL
Ketones, POC UA: NEGATIVE mg/dL
Nitrite, UA: NEGATIVE
Protein Ur, POC: NEGATIVE mg/dL
Spec Grav, UA: 1.02 (ref 1.010–1.025)
Urobilinogen, UA: 2 E.U./dL — AB
pH, UA: 7 (ref 5.0–8.0)

## 2023-06-11 LAB — POCT URINE PREGNANCY: Preg Test, Ur: NEGATIVE

## 2023-06-11 MED ORDER — KETOROLAC TROMETHAMINE 30 MG/ML IJ SOLN
INTRAMUSCULAR | Status: AC
  Filled 2023-06-11: qty 1

## 2023-06-11 MED ORDER — KETOROLAC TROMETHAMINE 30 MG/ML IJ SOLN
30.0000 mg | Freq: Once | INTRAMUSCULAR | Status: AC
  Administered 2023-06-11: 30 mg via INTRAMUSCULAR

## 2023-06-11 MED ORDER — NAPROXEN 375 MG PO TABS: 375.0000 mg | ORAL_TABLET | Freq: Two times a day (BID) | ORAL | 0 refills | Status: AC | PRN

## 2023-06-11 NOTE — ED Triage Notes (Signed)
Pt reports she is currently on her cycle and is having lower abdominal pain and low back pain "as if she was pregnant x 3 days.
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
## 2023-06-11 NOTE — Discharge Instructions (Signed)
Pregnancy test is negative. Your urine did not show any specific infection however, I am sending urine to lab for further testing to rule out an UTI as cause of symptoms. You are being treated for symptoms thought to be related to recent menstrual cycle (abdominal pain and back pain). You received a Toradol injection.  I have prescribed Naprosyn do not take Naprosyn until 8 hours after the injection that you received here in clinic.  As discussed apply warm heating pad to your pelvic region to help with pain.  At any point if your symptoms worsen or become severe go immediately to the emergency department for evaluation.  At present I recommend watch and wait and allow time for anti-inflammatories treat symptoms.

## 2023-06-16 ENCOUNTER — Emergency Department (HOSPITAL_COMMUNITY)
Admission: EM | Admit: 2023-06-16 | Discharge: 2023-06-17 | Disposition: A | Discharge status: Home / Self Care | Payer: No Typology Code available for payment source | Attending: Emergency Medicine | Admitting: Emergency Medicine

## 2023-06-16 ENCOUNTER — Encounter (HOSPITAL_COMMUNITY): Payer: Self-pay

## 2023-06-16 ENCOUNTER — Other Ambulatory Visit: Payer: Self-pay

## 2023-06-16 DIAGNOSIS — R1011 Right upper quadrant pain: Secondary | ICD-10-CM | POA: Insufficient documentation

## 2023-06-16 DIAGNOSIS — D649 Anemia, unspecified: Secondary | ICD-10-CM | POA: Insufficient documentation

## 2023-06-16 LAB — CBC
HCT: 33.3 % — ABNORMAL LOW (ref 36.0–46.0)
Hemoglobin: 10 g/dL — ABNORMAL LOW (ref 12.0–15.0)
MCH: 24.6 pg — ABNORMAL LOW (ref 26.0–34.0)
MCHC: 30 g/dL (ref 30.0–36.0)
MCV: 81.8 fL (ref 80.0–100.0)
Platelets: 483 10*3/uL — ABNORMAL HIGH (ref 150–400)
RBC: 4.07 MIL/uL (ref 3.87–5.11)
RDW: 15.5 % (ref 11.5–15.5)
WBC: 7.7 10*3/uL (ref 4.0–10.5)
nRBC: 0 % (ref 0.0–0.2)

## 2023-06-16 LAB — URINALYSIS, ROUTINE W REFLEX MICROSCOPIC
Bilirubin Urine: NEGATIVE
Glucose, UA: NEGATIVE mg/dL
Ketones, ur: NEGATIVE mg/dL
Nitrite: NEGATIVE
Protein, ur: NEGATIVE mg/dL
Specific Gravity, Urine: 1.023 (ref 1.005–1.030)
pH: 7 (ref 5.0–8.0)

## 2023-06-16 LAB — COMPREHENSIVE METABOLIC PANEL
ALT: 18 U/L (ref 0–44)
AST: 21 U/L (ref 15–41)
Albumin: 3.4 g/dL — ABNORMAL LOW (ref 3.5–5.0)
Alkaline Phosphatase: 101 U/L (ref 38–126)
Anion gap: 10 (ref 5–15)
BUN: 17 mg/dL (ref 6–20)
CO2: 23 mmol/L (ref 22–32)
Calcium: 8.5 mg/dL — ABNORMAL LOW (ref 8.9–10.3)
Chloride: 104 mmol/L (ref 98–111)
Creatinine, Ser: 0.57 mg/dL (ref 0.44–1.00)
GFR, Estimated: >60 mL/min (ref >60)
Glucose, Bld: 103 mg/dL — ABNORMAL HIGH (ref 70–99)
Potassium: 3.4 mmol/L — ABNORMAL LOW (ref 3.5–5.1)
Sodium: 137 mmol/L (ref 135–145)
Total Bilirubin: 0.4 mg/dL (ref 0.3–1.2)
Total Protein: 6.9 g/dL (ref 6.5–8.1)

## 2023-06-16 LAB — HCG, SERUM, QUALITATIVE: Preg, Serum: NEGATIVE

## 2023-06-16 LAB — LIPASE, BLOOD: Lipase: 30 U/L (ref 11–51)

## 2023-06-16 NOTE — ED Triage Notes (Addendum)
Patient arrived POV from home with complaint of right sided abdominal pain starting on 06/15/23.  Tender to palpation  denies N/V

## 2023-06-17 ENCOUNTER — Emergency Department (HOSPITAL_COMMUNITY): Payer: No Typology Code available for payment source

## 2023-06-17 MED ORDER — HYDROMORPHONE HCL 1 MG/ML IJ SOLN
1.0000 mg | Freq: Once | INTRAMUSCULAR | Status: AC
  Administered 2023-06-17: 1 mg via INTRAVENOUS
  Filled 2023-06-17: qty 1

## 2023-06-17 MED ORDER — ONDANSETRON HCL 4 MG/2ML IJ SOLN
4.0000 mg | Freq: Once | INTRAMUSCULAR | Status: AC
  Administered 2023-06-17: 4 mg via INTRAVENOUS
  Filled 2023-06-17: qty 2

## 2023-06-17 MED ORDER — IOHEXOL 350 MG/ML SOLN
75.0000 mL | Freq: Once | INTRAVENOUS | Status: AC | PRN
  Administered 2023-06-17: 75 mL via INTRAVENOUS

## 2023-06-17 MED ORDER — DICYCLOMINE HCL 10 MG PO CAPS
20.0000 mg | ORAL_CAPSULE | Freq: Once | ORAL | Status: AC
  Administered 2023-06-17: 20 mg via ORAL
  Filled 2023-06-17: qty 2

## 2023-06-17 MED ORDER — DICYCLOMINE HCL 20 MG PO TABS: 20.0000 mg | ORAL_TABLET | Freq: Two times a day (BID) | ORAL | 0 refills | Status: AC

## 2023-06-17 NOTE — ED Provider Notes (Signed)
Signout received on this 47 year old female who presented with abdominal pain.  Currently pending right upper quadrant ultrasound.  Workup otherwise reassuring so far.  CT chest mentioned concern for left-sided pneumonia.  Patient's pain is on the right side, she is afebrile, and without leukocytosis.  Low suspicion for pneumonia.  Bentyl sent in by previous provider.  See their note for full details. Physical Exam  BP 117/70 (BP Location: Right Arm)   Pulse 68   Temp 97.7 F (36.5 C) (Oral)   Resp 16   Ht 5\' 2"  (1.575 m)   Wt 68 kg   LMP 06/10/2023   SpO2 100%   BMI 27.44 kg/m     Procedures  Procedures  ED Course / MDM   Clinical Course as of 06/17/23 0735  Wed Jun 17, 2023  0981 Ketones, ur: NEGATIVE [WF]    Clinical Course User Index [WF] Carroll Sage, PA-C   Medical Decision Making Amount and/or Complexity of Data Reviewed Labs: ordered. Decision-making details documented in ED Course. Radiology: ordered.  Risk Prescription drug management.   Right upper quadrant ultrasound without acute finding.  She is appropriate for discharge.  She is in agreement with this plan.  Will give GI and PCP referral.       Marita Kansas, PA-C 06/17/23 0736    Gloris Manchester, MD 06/17/23 [phone]

## 2023-06-17 NOTE — Discharge Instructions (Addendum)
Lab workup and imaging are all reassuring.  Given you a medication that will help with your pain, you may also supplement with ibuprofen Tylenol.  Recommend follow-up with your primary doctor symptoms not improve after weeks time   Come back to the emergency department if you develop chest pain, shortness of breath, severe abdominal pain, uncontrolled nausea, vomiting, diarrhea.

## 2023-06-17 NOTE — ED Provider Notes (Signed)
[place] EMERGENCY DEPARTMENT AT [facility] Provider Note   CSN: [id number] Arrival date & time: 06/16/23  2139     History  Chief Complaint  Patient presents with   Abdominal Pain    Tracey Valdez is a 47 y.o. female.  HPI   Patient without significant medical history presented with complaints of right side pain.  Patient states this started a few days ago, states has gotten worse in intensity, remains constant, describes a bloating/cramping-like sensation, she feels it mainly in her right upper quadrant/right flank, states pain is worsened with deep inspiration or cough or laughing, no associated nausea vomiting constipation diarrhea, she denies any urinary symptoms, no history of PEs or DVTs no oral birth control no recent surgeries no long immobilizations denies any cough congestion general body aches, she has had no significant abdominal surgeries.  She is never had this before.    Home Medications Prior to Admission medications   Medication Sig Start Date End Date Taking? Authorizing Provider  dicyclomine (BENTYL) 20 MG tablet Take 1 tablet (20 mg total) by mouth 2 (two) times daily. 06/17/23  Yes Carroll Sage, PA-C  levonorgestrel (MIRENA) 20 MCG/24HR IUD 1 each by Intrauterine route once. 12/04/10   [provider]  naproxen (NAPROSYN) 375 MG tablet Take 1 tablet (375 mg total) by mouth 2 (two) times daily as needed. 06/11/23   Bing Neighbors, NP      Allergies    Patient has no known allergies.    Review of Systems   Review of Systems  Constitutional:  Negative for chills and fever.  Respiratory:  Negative for shortness of breath.   Cardiovascular:  Negative for chest pain.  Gastrointestinal:  Positive for abdominal pain.  Genitourinary:  Positive for flank pain.  Neurological:  Negative for headaches.    Physical Exam Updated Vital Signs BP 122/66   Pulse 75   Temp 99.3 F (37.4 C) (Oral)   Resp 20   Ht 5\' 2"  (1.575 m)    Wt 68 kg   LMP 06/10/2023   SpO2 100%   BMI 27.44 kg/m  Physical Exam Vitals and nursing note reviewed.  Constitutional:      General: She is not in acute distress.    Appearance: She is not ill-appearing.  HENT:     Head: Normocephalic and atraumatic.     Nose: No congestion.  Eyes:     Conjunctiva/sclera: Conjunctivae normal.  Cardiovascular:     Rate and Rhythm: Normal rate and regular rhythm.     Pulses: Normal pulses.     Heart sounds: No murmur heard.    No friction rub. No gallop.  Pulmonary:     Effort: No respiratory distress.     Breath sounds: No wheezing, rhonchi or rales.  Abdominal:     Palpations: Abdomen is soft.     Tenderness: There is abdominal tenderness. There is right CVA tenderness. There is no left CVA tenderness.     Comments: Abdomen nondistended, soft, tenderness mainly in the right flank/right CVA tenderness, no guarding rebound tenderness or peritoneal sign negative Murphy sign McBurney point.  Musculoskeletal:     Right lower leg: No edema.     Left lower leg: No edema.     Comments: No unilateral leg swelling no calf tenderness no palpable cords.  Skin:    General: Skin is warm and dry.  Neurological:     Mental Status: She is alert.  Psychiatric:  Mood and Affect: Mood normal.     ED Results / Procedures / Treatments   Labs (all labs ordered are listed, but only abnormal results are displayed) Labs Reviewed  COMPREHENSIVE METABOLIC PANEL - Abnormal; Notable for the following components:      Result Value   Potassium 3.4 (*)    Glucose, Bld 103 (*)    Calcium 8.5 (*)    Albumin 3.4 (*)    All other components within normal limits  CBC - Abnormal; Notable for the following components:   Hemoglobin 10.0 (*)    HCT 33.3 (*)    MCH 24.6 (*)    Platelets 483 (*)    All other components within normal limits  URINALYSIS, ROUTINE W REFLEX MICROSCOPIC - Abnormal; Notable for the following components:   APPearance HAZY (*)    Hgb  urine dipstick MODERATE (*)    Leukocytes,Ua MODERATE (*)    Bacteria, UA FEW (*)    All other components within normal limits  LIPASE, BLOOD  HCG, SERUM, QUALITATIVE    EKG EKG Interpretation Date/Time:  Tuesday June 16 2023 21:50:21 EDT Ventricular Rate:  83 PR Interval:  150 QRS Duration:  84 QT Interval:  352 QTC Calculation: 413 R Axis:   68  Text Interpretation: Normal sinus rhythm Normal ECG When compared with ECG of 24-Oct-2011 10:47, No acute changes Confirmed by Gilda Crease [phone]) on 06/17/2023 2:18:11 AM  Radiology CT Angio Chest PE W and/or Wo Contrast  Result Date: 06/17/2023 CLINICAL DATA:  High probability for pulmonary embolus. EXAM: CT ANGIOGRAPHY CHEST WITH CONTRAST TECHNIQUE: Multidetector CT imaging of the chest was performed using the standard protocol during bolus administration of intravenous contrast. Multiplanar CT image reconstructions and MIPs were obtained to evaluate the vascular anatomy. RADIATION DOSE REDUCTION: This exam was performed according to the departmental dose-optimization program which includes automated exposure control, adjustment of the mA and/or kV according to patient size and/or use of iterative reconstruction technique. CONTRAST:  75mL OMNIPAQUE IOHEXOL 350 MG/ML SOLN COMPARISON:  None Available. FINDINGS: Cardiovascular: Heart size upper normal. No substantial pericardial effusion. No thoracic aortic aneurysm. No substantial atherosclerosis of the thoracic aorta. There is no filling defect within the opacified pulmonary arteries to suggest the presence of an acute pulmonary embolus. Mediastinum/Nodes: No mediastinal lymphadenopathy. There is no hilar lymphadenopathy. The esophagus has normal imaging features. There is no axillary lymphadenopathy. Lungs/Pleura: Dependent ground-glass opacity is noted in both lungs probably atelectasis. Consolidative dense airspace disease is seen in the lingula. 10 mm ground-glass nodule noted left  upper lobe on image 25/6. No pleural effusion. Upper Abdomen: Visualized portion of the upper abdomen is unremarkable. Musculoskeletal: No worrisome lytic or sclerotic osseous abnormality. Review of the MIP images confirms the above findings. IMPRESSION: 1. No CT evidence for acute pulmonary embolus. 2. Small focus of consolidative dense airspace disease in the lingula, likely pneumonia. 3. 10 mm ground-glass nodule left upper lobe. Initial follow-up with CT at 6 months is recommended to confirm persistence. If persistent, repeat CT is recommended every 2 years until 5 years of stability has been established. This recommendation follows the consensus statement: Guidelines for Management of Incidental Pulmonary Nodules Detected on CT Images: From the Fleischner Society 2017; Radiology 2017; 284:228-243. Electronically Signed   By: Kennith Center M.D.   On: 06/17/2023 05:04   CT Renal Stone Study  Result Date: 06/17/2023 CLINICAL DATA:  Right-sided flank pain for several days, initial encounter EXAM: CT ABDOMEN AND PELVIS WITHOUT CONTRAST TECHNIQUE:  Multidetector CT imaging of the abdomen and pelvis was performed following the standard protocol without IV contrast. RADIATION DOSE REDUCTION: This exam was performed according to the departmental dose-optimization program which includes automated exposure control, adjustment of the mA and/or kV according to patient size and/or use of iterative reconstruction technique. COMPARISON:  12/14/2014 FINDINGS: Lower chest: No acute abnormality. Hepatobiliary: No focal liver abnormality is seen. No gallstones, gallbladder wall thickening, or biliary dilatation. Pancreas: Unremarkable. No pancreatic ductal dilatation or surrounding inflammatory changes. Spleen: Normal in size without focal abnormality. Adrenals/Urinary Tract: Adrenal glands are within normal limits. Kidneys show no renal calculi or urinary tract obstructive changes. The ureters are within normal limits. Bladder  is partially distended. Stomach/Bowel: The appendix is within normal limits. No obstructive or inflammatory changes of the colon are seen. Stomach and small bowel are within normal limits. Vascular/Lymphatic: No significant vascular findings are present. No enlarged abdominal or pelvic lymph nodes. Reproductive: Uterus and bilateral adnexa are unremarkable. Other: No abdominal wall hernia or abnormality. No abdominopelvic ascites. Musculoskeletal: Old healed pelvic fractures are noted. No acute bony abnormality seen IMPRESSION: No acute abnormality noted. Electronically Signed   By: Alcide Clever M.D.   On: 06/17/2023 03:38    Procedures Procedures    Medications Ordered in ED Medications  dicyclomine (BENTYL) capsule 20 mg (has no administration in time range)  HYDROmorphone (DILAUDID) injection 1 mg (1 mg Intravenous Given 06/17/23 0354)  ondansetron (ZOFRAN) injection 4 mg (4 mg Intravenous Given 06/17/23 0345)  iohexol (OMNIPAQUE) 350 MG/ML injection 75 mL (75 mLs Intravenous Contrast Given 06/17/23 0449)    ED Course/ Medical Decision Making/ A&P Clinical Course as of 06/17/23 0646  Wed Jun 17, 2023  1610 Ketones, ur: NEGATIVE [WF]    Clinical Course User Index [WF] Carroll Sage, PA-C                                 Medical Decision Making Amount and/or Complexity of Data Reviewed Labs: ordered. Radiology: ordered.  Risk Prescription drug management.   This patient presents to the ED for concern of right sided pain just, this involves an extensive number of treatment options, and is a complaint that carries with it a high risk of complications and morbidity.  The differential diagnosis includes pneumonia, PE, cholecystitis, kidney stone, UTI, pyelo-    Additional history obtained:  Additional history obtained from N/A External records from outside source obtained and reviewed including recent ER notes   Co morbidities that complicate the patient  evaluation  N/A  Social Determinants of Health:  None English speaking    Lab Tests:  I Ordered, and personally interpreted labs.  The pertinent results include: CBC shows normocytic anemia hemoglobin 10, CMP reveals potassium 3.4, glucose 103, calcium 8.5, lipase 30, hCG negative, UA shows moderate leukocytes red blood cells white blood cells few bacteria   Imaging Studies ordered:  I ordered imaging studies including CT renal, CTA of chest I independently visualized and interpreted imaging which showed the renal unremarkable, CTA negative for PE does show possible pneumonia in the left lingula, as well as groundglass nodules left upper lobe repeat in the next 6 months I agree with the radiologist interpretation   Cardiac Monitoring:  The patient was maintained on a cardiac monitor.  I personally viewed and interpreted the cardiac monitored which showed an underlying rhythm of: EKG without signs of ischemia   Medicines ordered and prescription  drug management:  I ordered medication including pain medication, antiemetics I have reviewed the patients home medicines and have made adjustments as needed  Critical Interventions:  N/A   Reevaluation:  Presents with right sided pain, triage obtain lab workup which I personally reviewed, UA is concerning for blood and leukocytes, presentation seems consistent with possible kidney stone, will send for CT imaging for further assessment  CT imaging is negative, she does endorse pleuritic chest pain, I am concerned for possible PE, will send send for CTA of chest for further evaluation.  CT scan reveals possible pneumonia, on further assessment patient's denies any cough congestion, she is also not endorsing any urinary symptoms, suspicion for pneumonia or UTI is low with for antibiotics, states she still having right upper quadrant tenderness, it is possible she could have biliary colic versus cholecystitis, will send for ultrasound  for further assessment.  Consultations Obtained:  N/a    Test Considered:  N/a    Rule out I have low suspicion for ACS as history is atypical, patient has no cardiac history, EKG was sinus rhythm without signs of ischemia. Low suspicion for PE CTA is negative. Low suspicion for AAA or aortic dissection as history is atypical, patient has low risk factors.  low suspicion for lower lobe pneumonia as lung sounds are clear bilaterally, will defer imaging at this time.  I have low suspicion for liver or gallbladder abnormality as she has no right upper quadrant tenderness, liver enzymes, alk phos, T bili all within normal limits, ultrasound pending.  Low suspicion for pancreatitis as lipase is within normal limits.  Suspicion for Pil[place], kidney stone, doubt obstruction, volvulus, diverticulitis, intra-abdominal mass or infection CT imaging negative for these findings  Dispostion and problem list  Due to shift change patient handoff to [facility]  Follow-up on ultrasounds, if unremarkable, will discharge home, follow-up with primary care doctor symptoms do not improve proving            Final Clinical Impression(s) / ED Diagnoses Final diagnoses:  Right upper quadrant abdominal pain    Rx / DC Orders ED Discharge Orders          Ordered    dicyclomine (BENTYL) 20 MG tablet  2 times daily        06/17/23 0642              Carroll Sage, PA-C 06/17/23 4270    Gilda Crease, MD 06/19/23 [phone]
